# Patient Record
Sex: Male | Born: 1955 | Race: White | Hispanic: No | Marital: Married | Smoking: Former smoker
Health system: Southern US, Community
[De-identification: ages and names within clinical notes are randomized; demographics above are authoritative.]

## PROBLEM LIST (undated history)

## (undated) DIAGNOSIS — Z85528 Personal history of other malignant neoplasm of kidney: Secondary | ICD-10-CM

## (undated) DIAGNOSIS — G4730 Sleep apnea, unspecified: Secondary | ICD-10-CM

## (undated) DIAGNOSIS — M109 Gout, unspecified: Secondary | ICD-10-CM

## (undated) DIAGNOSIS — K5790 Diverticulosis of intestine, part unspecified, without perforation or abscess without bleeding: Secondary | ICD-10-CM

## (undated) DIAGNOSIS — K219 Gastro-esophageal reflux disease without esophagitis: Secondary | ICD-10-CM

## (undated) DIAGNOSIS — I10 Essential (primary) hypertension: Secondary | ICD-10-CM

## (undated) DIAGNOSIS — T7840XA Allergy, unspecified, initial encounter: Secondary | ICD-10-CM

## (undated) DIAGNOSIS — T884XXA Failed or difficult intubation, initial encounter: Secondary | ICD-10-CM

## (undated) DIAGNOSIS — C801 Malignant (primary) neoplasm, unspecified: Secondary | ICD-10-CM

## (undated) DIAGNOSIS — N200 Calculus of kidney: Secondary | ICD-10-CM

## (undated) DIAGNOSIS — R7303 Prediabetes: Secondary | ICD-10-CM

## (undated) HISTORY — PX: EXCISION OF ORAL TUMOR: SHX6269

## (undated) HISTORY — PX: COLONOSCOPY: SHX174

## (undated) HISTORY — DX: Allergy, unspecified, initial encounter: T78.40XA

## (undated) HISTORY — DX: Sleep apnea, unspecified: G47.30

## (undated) HISTORY — PX: POLYPECTOMY: SHX149

## (undated) HISTORY — DX: Prediabetes: R73.03

## (undated) HISTORY — PX: SURGERY SCROTAL / TESTICULAR: SUR1316

## (undated) HISTORY — DX: Personal history of other malignant neoplasm of kidney: Z85.528

---

## 2003-01-14 ENCOUNTER — Ambulatory Visit (HOSPITAL_COMMUNITY): Admission: RE | Admit: 2003-01-14 | Discharge: 2003-01-14 | Payer: Self-pay | Admitting: Urology

## 2003-03-29 ENCOUNTER — Emergency Department (HOSPITAL_COMMUNITY): Admission: EM | Admit: 2003-03-29 | Discharge: 2003-03-29 | Payer: Self-pay | Admitting: Emergency Medicine

## 2003-08-16 ENCOUNTER — Ambulatory Visit (HOSPITAL_COMMUNITY): Admission: RE | Admit: 2003-08-16 | Discharge: 2003-08-16 | Payer: Self-pay | Admitting: Urology

## 2004-03-04 ENCOUNTER — Ambulatory Visit (HOSPITAL_COMMUNITY): Admission: RE | Admit: 2004-03-04 | Discharge: 2004-03-04 | Payer: Self-pay | Admitting: Urology

## 2004-03-15 DIAGNOSIS — Z85528 Personal history of other malignant neoplasm of kidney: Secondary | ICD-10-CM

## 2004-03-15 HISTORY — PX: PARTIAL NEPHRECTOMY: SHX414

## 2004-03-15 HISTORY — DX: Personal history of other malignant neoplasm of kidney: Z85.528

## 2004-09-03 ENCOUNTER — Ambulatory Visit (HOSPITAL_COMMUNITY): Admission: RE | Admit: 2004-09-03 | Discharge: 2004-09-03 | Payer: Self-pay | Admitting: Urology

## 2005-08-05 ENCOUNTER — Ambulatory Visit (HOSPITAL_COMMUNITY): Admission: RE | Admit: 2005-08-05 | Discharge: 2005-08-05 | Payer: Self-pay | Admitting: Urology

## 2008-02-16 ENCOUNTER — Ambulatory Visit (HOSPITAL_COMMUNITY): Admission: RE | Admit: 2008-02-16 | Discharge: 2008-02-16 | Payer: Self-pay | Admitting: Urology

## 2011-08-03 ENCOUNTER — Ambulatory Visit (HOSPITAL_COMMUNITY)
Admission: RE | Admit: 2011-08-03 | Discharge: 2011-08-03 | Disposition: A | Discharge status: Home / Self Care | Payer: 59 | Source: Ambulatory Visit | Attending: Urology | Admitting: Urology

## 2011-08-03 ENCOUNTER — Other Ambulatory Visit: Payer: Self-pay | Admitting: Urology

## 2011-08-03 DIAGNOSIS — I10 Essential (primary) hypertension: Secondary | ICD-10-CM | POA: Insufficient documentation

## 2011-08-03 DIAGNOSIS — Z87891 Personal history of nicotine dependence: Secondary | ICD-10-CM | POA: Insufficient documentation

## 2011-08-03 DIAGNOSIS — C649 Malignant neoplasm of unspecified kidney, except renal pelvis: Secondary | ICD-10-CM | POA: Insufficient documentation

## 2013-05-08 ENCOUNTER — Encounter (HOSPITAL_COMMUNITY): Payer: Self-pay | Admitting: Oral and Maxillofacial Surgery

## 2013-05-08 DIAGNOSIS — K001 Supernumerary teeth: Secondary | ICD-10-CM

## 2013-05-08 DIAGNOSIS — M2740 Unspecified cyst of jaw: Secondary | ICD-10-CM | POA: Diagnosis present

## 2013-05-08 NOTE — Pre-Procedure Instructions (Signed)
Brian Sawyer  05/08/2013   Your procedure is scheduled on:  Monday May 14, 2013 at 7:15 AM.  Report to [facility] Stay Entrance "A"  Admitting at 5:30 AM.  Call this number if you have problems the morning of surgery: [phone]   Remember:   Do not eat food or drink liquids after midnight.   Take these medicines the morning of surgery with A SIP OF WATER: Zantac if needed   Do not wear jewelry.  Do not wear lotions, powders, or colognes. You may wear deodorant.  Men may shave face and neck.  Do not bring valuables to the hospital.  [facility] is not responsible for any belongings or valuables.               Contacts, dentures or bridgework may not be worn into surgery.  Leave suitcase in the car. After surgery it may be brought to your room.  For patients admitted to the hospital, discharge time is determined by your treatment team.               Patients discharged the day of surgery will not be allowed to drive home.  Name and phone number of your driver: Family/Friend  Special Instructions: Please shower using CHG soap the night before and the morning of your surgery   Please read over the following fact sheets that you were given: Pain Booklet, Coughing and Deep Breathing and Surgical Site Infection Prevention

## 2013-05-08 NOTE — H&P (Signed)
  Brian Sawyer is an 58 y.o. male.   Chief Complaint: "Swelling under my nose" HPI: Brian Sawyer is a 58 year old male that was referred to me by his Periodontist, Dr. Holley Bouche, for evaluation of swelling of the anterior maxilla.  A CBCT scan was taken and showed that the patient had a large cystic lesion of the pre-maxilla region associated with an impacted supernumerary #59.  I performed an incisional biopsy of the area in my office and sent the specimen to [facility]ology.  The pathology report concluded the lesion was a hemorrhagic dentigerious cyst.  Therefore, the patient will go to the OR for enucleation of the cyst and removal of the impacted #59.  The patient also reports a history of difficult intubation during his previous surgeries in the operating room.    PMHx:  Past Medical History  Diagnosis Date  . Difficult intubation   . Hypertension   . Gout   . Cancer 2006    Kidney Cancer  . Kidney stones     Kidney stones, right    PSx:  Past Surgical History  Procedure Laterality Date  . Partial nephrectomy Left 2006  . Surgery scrotal / testicular      Family Hx: History reviewed. No pertinent family history.  Social History:  reports that he quit smoking about 37 years ago. His smoking use included Cigarettes. He smoked 0.00 packs per day for 4 years. He does not have any smokeless tobacco history on file. He reports that he does not drink alcohol or use illicit drugs.  Allergies: No Known Allergies  Meds: Allopurinol 300 mg daily.  Labs: No results found for this or any previous visit (from the past 48 hour(s)).  Radiology: CBCT scan - cyst of the anterior maxilla associated with impacted tooth #59.  ROS: Pertinent items are noted in HPI.  Vitals: There were no vitals taken for this visit.  Physical Exam: General appearance: alert and cooperative Head: Normocephalic, without obvious abnormality, atraumatic Eyes: conjunctivae/corneas clear. PERRL, EOM's intact.  Fundi benign. Ears: normal TM's and external ear canals both ears Nose: mild swelling, Distortion of the alar base and swelling in the collumella / philtrum region Throat: abnormal findings: Gingival expansion in the anterior maxilla and lingualized #9. Neck: no adenopathy, no carotid bruit, no JVD, supple, symmetrical, trachea midline and thyroid not enlarged, symmetric, no tenderness/mass/nodules Resp: clear to auscultation bilaterally Cardio: regular rate and rhythm, S1, S2 normal, no murmur, click, rub or gallop GI: soft, non-tender; bowel sounds normal; no masses,  no organomegaly Extremities: extremities normal, atraumatic, no cyanosis or edema Pulses: 2+ and symmetric Skin: Skin color, texture, turgor normal. No rashes or lesions Lymph nodes: Cervical, supraclavicular, and axillary nodes normal. Neurologic: Alert and oriented X 3, normal strength and tone. Normal symmetric reflexes. Normal coordination and gait Occlusion is stable and repeatable, no odontogenic infection  noted. Large bilateral mandibular tori present.  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 1. I recommend that the patient have a fiber optic oral intubation using an Oral-rae tube. 2. In the OR I will Enucleate and Currettage his cyst of the anterior maxilla and remove the impacted #59.   3. Post operative instructions and prescriptions given today in my office today.  [place],Brenlee Koskela L  05/08/2013, 6:02 PM

## 2013-05-09 ENCOUNTER — Encounter (HOSPITAL_COMMUNITY)
Admission: RE | Admit: 2013-05-09 | Discharge: 2013-05-09 | Disposition: A | Discharge status: Home / Self Care | Payer: 59 | Source: Ambulatory Visit | Attending: Oral and Maxillofacial Surgery | Admitting: Oral and Maxillofacial Surgery

## 2013-05-09 ENCOUNTER — Encounter (HOSPITAL_COMMUNITY): Payer: Self-pay

## 2013-05-09 DIAGNOSIS — Z01812 Encounter for preprocedural laboratory examination: Secondary | ICD-10-CM | POA: Insufficient documentation

## 2013-05-09 DIAGNOSIS — Z01810 Encounter for preprocedural cardiovascular examination: Secondary | ICD-10-CM | POA: Insufficient documentation

## 2013-05-09 DIAGNOSIS — Z01818 Encounter for other preprocedural examination: Secondary | ICD-10-CM | POA: Insufficient documentation

## 2013-05-09 HISTORY — DX: Gastro-esophageal reflux disease without esophagitis: K21.9

## 2013-05-09 HISTORY — DX: Diverticulosis of intestine, part unspecified, without perforation or abscess without bleeding: K57.90

## 2013-05-09 LAB — BASIC METABOLIC PANEL
BUN: 13 mg/dL (ref 6–23)
CO2: 28 mEq/L (ref 19–32)
Calcium: 9.7 mg/dL (ref 8.4–10.5)
Chloride: 101 mEq/L (ref 96–112)
Creatinine, Ser: 0.9 mg/dL (ref 0.50–1.35)
GFR calc Af Amer: >90 mL/min (ref >90)
GFR calc non Af Amer: >90 mL/min (ref >90)
Glucose, Bld: 115 mg/dL — ABNORMAL HIGH (ref 70–99)
Potassium: 4.8 mEq/L (ref 3.7–5.3)
Sodium: 141 mEq/L (ref 137–147)

## 2013-05-09 LAB — CBC
HCT: 43.8 % (ref 39.0–52.0)
Hemoglobin: 15.1 g/dL (ref 13.0–17.0)
MCH: 30 pg (ref 26.0–34.0)
MCHC: 34.5 g/dL (ref 30.0–36.0)
MCV: 86.9 fL (ref 78.0–100.0)
Platelets: 191 10*3/uL (ref 150–400)
RBC: 5.04 MIL/uL (ref 4.22–5.81)
RDW: 14.2 % (ref 11.5–15.5)
WBC: 5.7 10*3/uL (ref 4.0–10.5)

## 2013-05-09 NOTE — Progress Notes (Signed)
05/09/13 0841  OBSTRUCTIVE SLEEP APNEA  BMI more than 35 kg/m2? 0  Obstructive Sleep Apnea Score 0  Score 4 or greater  Results sent to PCP   This patient is at risk for sleep apnea using the STOP Bang tool used during a pre-surgical visit. A score of 4 or greater is at risk for sleep apnea.

## 2013-05-09 NOTE — Progress Notes (Addendum)
Anesthesia Note:  Patient is a 58 year old male with a hemorrhagic inflamed dentigrous cyst who is scheduled for cyst removal and removal of tooth # 59 and placement of bone graft in the interior maxilla on 05/14/13 by Dr. Buelah Manis.  He is scheduled to be a first case.    Anesthesia history includes DIFFICULT INTUBATION in 2005/2006 at [facility] when he underwent left partial nephrectomy for renal cancer.  Patient was seen by Lelon Perla, CRNA regarding difficult intubation history.  By her account, he was noted to have a somewhat small mouth opening (3 finger breadths) and limited neck mobility. There was also some shifting of his left teeth secondary to his cyst.  Mallampati 3. Jeani Hawking discussed teeth advisory and possible options for intubation including glidescope, awake look, fiberoptic intubation.    History includes HTN (not requiring medication for several years), right nephrolithiasis, left renal cancer s/p partial nephrectomy, GERD, diverticulosis, former smoker. He is not currently seeing a PCP.    EKG on 05/09/13 showed NSR, LAD.  CXR on 05/09/13 showed no active cardiopulmonary disease.  Preoperative labs noted.    Anesthesia records from [facility] are still pending.  I'll review once available.  Darion Hugh [facility] Short Stay Center/Anesthesiology Phone [phone] 05/09/2013 5:23 PM  Addendum: 05/10/2013 6:00 PM Anesthesia records from [facility] from 04/17/03 reviewed.  According to notes, he was an unanticipated difficult intubation requiring multiple attempts at airway securing.  DL X 4 and Fast Track LMA were unsuccessful.  Ultimately successful with an asleep fiberoptic intubation. Awake or asleep FOI for future GETA recommended.

## 2013-05-09 NOTE — Progress Notes (Signed)
Patient informed Nurse that he was a difficult intubation and it was discovered when having a partial nephrectomy in 2005 at [facility]. Will request records. Nurse called Ebony Hail, [place] however she was not available at this time. Nurse also called Dr. Conrad [place] (Anesthesia) however he was noted to be at Day Surgery. Lelon Perla called for assistance and she stated she would come to talk to patient. Patient denied having a PCP and patient sees Dr. Franchot Gallo at [facility] whom he has not seen in years as he has not had any issues. Patient denied having any kidney, cardiac, or pulmonary issues.

## 2013-05-10 ENCOUNTER — Encounter (HOSPITAL_COMMUNITY): Payer: Self-pay

## 2013-05-13 MED ORDER — CEFAZOLIN SODIUM-DEXTROSE 2-3 GM-% IV SOLR
2.0000 g | INTRAVENOUS | Status: AC
  Administered 2013-05-14: 2 g via INTRAVENOUS
  Filled 2013-05-13: qty 50

## 2013-05-14 ENCOUNTER — Encounter (HOSPITAL_COMMUNITY): Payer: 59 | Admitting: Vascular Surgery

## 2013-05-14 ENCOUNTER — Encounter (HOSPITAL_COMMUNITY): Payer: Self-pay | Admitting: *Deleted

## 2013-05-14 ENCOUNTER — Ambulatory Visit (HOSPITAL_COMMUNITY): Payer: 59 | Admitting: Anesthesiology

## 2013-05-14 ENCOUNTER — Encounter (HOSPITAL_COMMUNITY)
Admission: RE | Disposition: A | Discharge status: Home / Self Care | Payer: Self-pay | Source: Ambulatory Visit | Attending: Oral and Maxillofacial Surgery

## 2013-05-14 ENCOUNTER — Ambulatory Visit (HOSPITAL_COMMUNITY)
Admission: RE | Admit: 2013-05-14 | Discharge: 2013-05-14 | Disposition: A | Discharge status: Home / Self Care | Payer: 59 | Source: Ambulatory Visit | Attending: Oral and Maxillofacial Surgery | Admitting: Oral and Maxillofacial Surgery

## 2013-05-14 DIAGNOSIS — J338 Other polyp of sinus: Secondary | ICD-10-CM | POA: Insufficient documentation

## 2013-05-14 DIAGNOSIS — Z85528 Personal history of other malignant neoplasm of kidney: Secondary | ICD-10-CM | POA: Insufficient documentation

## 2013-05-14 DIAGNOSIS — K090 Developmental odontogenic cysts: Secondary | ICD-10-CM | POA: Insufficient documentation

## 2013-05-14 DIAGNOSIS — Z87442 Personal history of urinary calculi: Secondary | ICD-10-CM | POA: Insufficient documentation

## 2013-05-14 DIAGNOSIS — Z87891 Personal history of nicotine dependence: Secondary | ICD-10-CM | POA: Insufficient documentation

## 2013-05-14 DIAGNOSIS — M109 Gout, unspecified: Secondary | ICD-10-CM | POA: Insufficient documentation

## 2013-05-14 DIAGNOSIS — M2740 Unspecified cyst of jaw: Secondary | ICD-10-CM | POA: Diagnosis present

## 2013-05-14 DIAGNOSIS — K001 Supernumerary teeth: Secondary | ICD-10-CM

## 2013-05-14 DIAGNOSIS — I10 Essential (primary) hypertension: Secondary | ICD-10-CM | POA: Insufficient documentation

## 2013-05-14 HISTORY — DX: Failed or difficult intubation, initial encounter: T88.4XXA

## 2013-05-14 HISTORY — PX: EAR CYST EXCISION: SHX22

## 2013-05-14 HISTORY — DX: Calculus of kidney: N20.0

## 2013-05-14 HISTORY — PX: TOOTH EXTRACTION: SHX859

## 2013-05-14 HISTORY — DX: Essential (primary) hypertension: I10

## 2013-05-14 HISTORY — DX: Gout, unspecified: M10.9

## 2013-05-14 HISTORY — DX: Malignant (primary) neoplasm, unspecified: C80.1

## 2013-05-14 SURGERY — CYST REMOVAL
Anesthesia: General | Site: Mouth

## 2013-05-14 MED ORDER — ROCURONIUM BROMIDE 50 MG/5ML IV SOLN
INTRAVENOUS | Status: AC
  Filled 2013-05-14: qty 1

## 2013-05-14 MED ORDER — PHENYLEPHRINE HCL 10 MG/ML IJ SOLN
INTRAMUSCULAR | Status: DC | PRN
  Administered 2013-05-14: 80 ug via INTRAVENOUS
  Administered 2013-05-14: 40 ug via INTRAVENOUS
  Administered 2013-05-14 (×3): 80 ug via INTRAVENOUS

## 2013-05-14 MED ORDER — ACETAMINOPHEN 325 MG PO TABS: 325.0000 mg | ORAL_TABLET | ORAL | Status: DC | PRN

## 2013-05-14 MED ORDER — LACTATED RINGERS IV SOLN
INTRAVENOUS | Status: DC | PRN
  Administered 2013-05-14 (×2): via INTRAVENOUS

## 2013-05-14 MED ORDER — OXYMETAZOLINE HCL 0.05 % NA SOLN
NASAL | Status: AC
  Filled 2013-05-14: qty 15

## 2013-05-14 MED ORDER — FENTANYL CITRATE 0.05 MG/ML IJ SOLN
INTRAMUSCULAR | Status: DC | PRN
  Administered 2013-05-14 (×2): 50 ug via INTRAVENOUS

## 2013-05-14 MED ORDER — FENTANYL CITRATE 0.05 MG/ML IJ SOLN: 25.0000 ug | INTRAMUSCULAR | Status: DC | PRN

## 2013-05-14 MED ORDER — DEXAMETHASONE SODIUM PHOSPHATE 4 MG/ML IJ SOLN
INTRAMUSCULAR | Status: AC
  Filled 2013-05-14: qty 1

## 2013-05-14 MED ORDER — ONDANSETRON HCL 4 MG/2ML IJ SOLN
INTRAMUSCULAR | Status: DC | PRN
  Administered 2013-05-14: 4 mg via INTRAVENOUS

## 2013-05-14 MED ORDER — LIDOCAINE HCL (CARDIAC) 20 MG/ML IV SOLN
INTRAVENOUS | Status: DC | PRN
  Administered 2013-05-14: 80 mg via INTRAVENOUS

## 2013-05-14 MED ORDER — EPHEDRINE SULFATE 50 MG/ML IJ SOLN
INTRAMUSCULAR | Status: DC | PRN
Start: 2013-05-14 — End: 2013-05-14
  Administered 2013-05-14: 5 mg via INTRAVENOUS
  Administered 2013-05-14 (×2): 10 mg via INTRAVENOUS

## 2013-05-14 MED ORDER — ONDANSETRON HCL 4 MG/2ML IJ SOLN: 4.0000 mg | Freq: Once | INTRAMUSCULAR | Status: DC | PRN

## 2013-05-14 MED ORDER — LIDOCAINE-EPINEPHRINE 2 %-1:100000 IJ SOLN
INTRAMUSCULAR | Status: AC
  Filled 2013-05-14: qty 17

## 2013-05-14 MED ORDER — SUCCINYLCHOLINE CHLORIDE 20 MG/ML IJ SOLN
INTRAMUSCULAR | Status: DC | PRN
  Administered 2013-05-14: 100 mg via INTRAVENOUS

## 2013-05-14 MED ORDER — ACETAMINOPHEN 160 MG/5ML PO SOLN
325.0000 mg | ORAL | Status: DC | PRN
  Filled 2013-05-14: qty 20.3

## 2013-05-14 MED ORDER — PROPOFOL 10 MG/ML IV BOLUS
INTRAVENOUS | Status: AC
  Filled 2013-05-14: qty 20

## 2013-05-14 MED ORDER — SUCCINYLCHOLINE CHLORIDE 20 MG/ML IJ SOLN
INTRAMUSCULAR | Status: AC
  Filled 2013-05-14: qty 1

## 2013-05-14 MED ORDER — HEMOSTATIC AGENTS (NO CHARGE) OPTIME
TOPICAL | Status: DC | PRN
  Administered 2013-05-14: 1 via TOPICAL

## 2013-05-14 MED ORDER — 0.9 % SODIUM CHLORIDE (POUR BTL) OPTIME
TOPICAL | Status: DC | PRN
  Administered 2013-05-14: 1000 mL

## 2013-05-14 MED ORDER — METHYLENE BLUE 1 % INJ SOLN
INTRAMUSCULAR | Status: AC
  Filled 2013-05-14: qty 10

## 2013-05-14 MED ORDER — LIDOCAINE HCL (CARDIAC) 20 MG/ML IV SOLN
INTRAVENOUS | Status: AC
  Filled 2013-05-14: qty 5

## 2013-05-14 MED ORDER — BUPIVACAINE-EPINEPHRINE (PF) 0.5% -1:200000 IJ SOLN
INTRAMUSCULAR | Status: AC
  Filled 2013-05-14: qty 10.8

## 2013-05-14 MED ORDER — DEXAMETHASONE SODIUM PHOSPHATE 4 MG/ML IJ SOLN
INTRAMUSCULAR | Status: DC | PRN
  Administered 2013-05-14 (×2): 4 mg via INTRAVENOUS

## 2013-05-14 MED ORDER — PROPOFOL 10 MG/ML IV BOLUS
INTRAVENOUS | Status: DC | PRN
  Administered 2013-05-14: 150 mg via INTRAVENOUS
  Administered 2013-05-14: 50 mg via INTRAVENOUS

## 2013-05-14 MED ORDER — PHENYLEPHRINE HCL 10 MG/ML IJ SOLN
10.0000 mg | INTRAVENOUS | Status: DC | PRN
  Administered 2013-05-14: 30 ug/min via INTRAVENOUS
  Administered 2013-05-14: 15 ug/min via INTRAVENOUS

## 2013-05-14 MED ORDER — ONDANSETRON HCL 4 MG/2ML IJ SOLN
INTRAMUSCULAR | Status: AC
  Filled 2013-05-14: qty 2

## 2013-05-14 MED ORDER — EPHEDRINE SULFATE 50 MG/ML IJ SOLN
INTRAMUSCULAR | Status: AC
  Filled 2013-05-14: qty 1

## 2013-05-14 MED ORDER — KETOROLAC TROMETHAMINE 30 MG/ML IJ SOLN: 15.0000 mg | Freq: Once | INTRAMUSCULAR | Status: DC | PRN

## 2013-05-14 MED ORDER — ARTIFICIAL TEARS OP OINT
TOPICAL_OINTMENT | OPHTHALMIC | Status: AC
  Filled 2013-05-14: qty 3.5

## 2013-05-14 MED ORDER — STERILE WATER FOR INJECTION IJ SOLN
INTRAMUSCULAR | Status: AC
  Filled 2013-05-14: qty 10

## 2013-05-14 MED ORDER — LIDOCAINE-EPINEPHRINE 2 %-1:100000 IJ SOLN
INTRAMUSCULAR | Status: DC | PRN
  Administered 2013-05-14: 6.8 mL

## 2013-05-14 MED ORDER — MIDAZOLAM HCL 2 MG/2ML IJ SOLN
INTRAMUSCULAR | Status: AC
  Filled 2013-05-14: qty 2

## 2013-05-14 MED ORDER — MIDAZOLAM HCL 5 MG/5ML IJ SOLN
INTRAMUSCULAR | Status: DC | PRN
  Administered 2013-05-14 (×2): 1 mg via INTRAVENOUS

## 2013-05-14 MED ORDER — BUPIVACAINE-EPINEPHRINE 0.5% -1:200000 IJ SOLN
INTRAMUSCULAR | Status: DC | PRN
  Administered 2013-05-14: 3.6 mL

## 2013-05-14 MED ORDER — FENTANYL CITRATE 0.05 MG/ML IJ SOLN
INTRAMUSCULAR | Status: AC
  Filled 2013-05-14: qty 5

## 2013-05-14 SURGICAL SUPPLY — 56 items
ALCOHOL 70% 16 OZ (MISCELLANEOUS) ×2 IMPLANT
ATTRACTOMAT 16X20 MAGNETIC DRP (DRAPES) ×3 IMPLANT
BLADE SURG 15 STRL LF DISP TIS (BLADE) ×2 IMPLANT
BLADE SURG 15 STRL SS (BLADE) ×6
BONE CANC CHIPS 20CC PCAN1/4 (Bone Implant) ×3 IMPLANT
BONE CHIP PRESERV 5CC PCAN5 (Bone Implant) ×3 IMPLANT
BUR CROSS CUT FISSURE 1.6 (BURR) ×1 IMPLANT
BUR CROSS CUT FISSURE 1.6MM (BURR) ×1
BUR EGG ELITE 4.0 (BURR) ×1 IMPLANT
BUR EGG ELITE 4.0MM (BURR) ×1
BUR RND FLUTED 2.5 (BURR) ×2 IMPLANT
CANISTER SUCTION 2500CC (MISCELLANEOUS) ×3 IMPLANT
CHIPS CANC BONE 20CC PCAN1/4 (Bone Implant) ×1 IMPLANT
CLEANER TIP ELECTROSURG 2X2 (MISCELLANEOUS) ×2 IMPLANT
CONT SPEC 4OZ CLIKSEAL STRL BL (MISCELLANEOUS) ×6 IMPLANT
COVER SURGICAL LIGHT HANDLE (MISCELLANEOUS) ×3 IMPLANT
ELECT COATED BLADE 2.86 ST (ELECTRODE) ×2 IMPLANT
ELECT NDL BLADE 2-5/6 (NEEDLE) IMPLANT
ELECT NEEDLE BLADE 2-5/6 (NEEDLE) ×3 IMPLANT
ELECT REM PT RETURN 9FT ADLT (ELECTROSURGICAL) ×3
ELECTRODE REM PT RTRN 9FT ADLT (ELECTROSURGICAL) IMPLANT
GAUZE PACKING FOLDED 2  STR (GAUZE/BANDAGES/DRESSINGS) ×2
GAUZE PACKING FOLDED 2 STR (GAUZE/BANDAGES/DRESSINGS) ×1 IMPLANT
GAUZE SPONGE 4X4 16PLY XRAY LF (GAUZE/BANDAGES/DRESSINGS) ×3 IMPLANT
GLOVE BIO SURGEON STRL SZ7 (GLOVE) ×5 IMPLANT
GLOVE BIOGEL PI IND STRL 6.5 (GLOVE) IMPLANT
GLOVE BIOGEL PI INDICATOR 6.5 (GLOVE) ×2
GLOVE ORTHO TXT STRL SZ7.5 (GLOVE) ×3 IMPLANT
GOWN STRL REUS W/ TWL LRG LVL3 (GOWN DISPOSABLE) IMPLANT
GOWN STRL REUS W/TWL LRG LVL3 (GOWN DISPOSABLE) ×6
GRAFT BNE CANC CHIPS 1-8 20CC (Bone Implant) IMPLANT
GRAFT BNE CANC CHIPS 1-8 5CC (Bone Implant) IMPLANT
KIT BASIN OR (CUSTOM PROCEDURE TRAY) ×3 IMPLANT
KIT ROOM TURNOVER OR (KITS) ×3 IMPLANT
NDL BLUNT 16X1.5 OR ONLY (NEEDLE) ×2 IMPLANT
NDL DENTAL 27 LONG (NEEDLE) ×1 IMPLANT
NEEDLE BLUNT 16X1.5 OR ONLY (NEEDLE) ×6 IMPLANT
NEEDLE DENTAL 27 LONG (NEEDLE) ×6 IMPLANT
NS IRRIG 1000ML POUR BTL (IV SOLUTION) ×3 IMPLANT
PACK EENT II TURBAN DRAPE (CUSTOM PROCEDURE TRAY) ×3 IMPLANT
PAD ARMBOARD 7.5X6 YLW CONV (MISCELLANEOUS) ×6 IMPLANT
PENCIL BUTTON HOLSTER BLD 10FT (ELECTRODE) ×2 IMPLANT
SOLUTION BETADINE 4OZ (MISCELLANEOUS) ×2 IMPLANT
SPONGE GAUZE 4X4 12PLY (GAUZE/BANDAGES/DRESSINGS) IMPLANT
SPONGE SURGIFOAM ABS GEL 12-7 (HEMOSTASIS) ×2 IMPLANT
SUT CHROMIC 3 0 PS 2 (SUTURE) ×3 IMPLANT
SUT VIC AB 4-0 RB1 27 (SUTURE) ×3
SUT VIC AB 4-0 RB1 27X BRD (SUTURE) IMPLANT
SYR 50ML SLIP (SYRINGE) ×6 IMPLANT
SYR BULB IRRIGATION 50ML (SYRINGE) IMPLANT
TOOTHBRUSH ADULT (PERSONAL CARE ITEMS) ×2 IMPLANT
TOWEL OR 17X24 6PK STRL BLUE (TOWEL DISPOSABLE) ×3 IMPLANT
TOWEL OR 17X26 10 PK STRL BLUE (TOWEL DISPOSABLE) ×3 IMPLANT
TUBE CONNECTING 12'X1/4 (SUCTIONS) ×1
TUBE CONNECTING 12X1/4 (SUCTIONS) ×2 IMPLANT
WATER STERILE IRR 1000ML POUR (IV SOLUTION) ×1 IMPLANT

## 2013-05-14 NOTE — Anesthesia Procedure Notes (Addendum)
Performed by: Berle Mull   Procedure Name: Intubation Date/Time: 05/14/2013 7:45 AM Performed by: Berle Mull Pre-anesthesia Checklist: Patient identified, Timeout performed, Emergency Drugs available, Suction available and Patient being monitored Patient Re-evaluated:Patient Re-evaluated prior to inductionOxygen Delivery Method: Circle system utilized Preoxygenation: Pre-oxygenation with 100% oxygen Intubation Type: IV induction and Inhalational induction Ventilation: Two handed mask ventilation required and Oral airway inserted - appropriate to patient size Grade View: Grade I Tube type: Oral Rae Tube size: 7.5 mm Number of attempts: 1 Airway Equipment and Method: Video-laryngoscopy and Stylet Placement Confirmation: ETT inserted through vocal cords under direct vision,  positive ETCO2 and breath sounds checked- equal and bilateral Secured at: 21 cm Tube secured with: Tape Dental Injury: Teeth and Oropharynx as per pre-operative assessment  Future Recommendations: Recommend- induction with short-acting agent, and alternative techniques readily available Comments: Pt. Required two-handed mask with oral airway.  Despite history of difficult intubation at outside hospital, Grade I view with Glidescope.

## 2013-05-14 NOTE — OR Nursing (Signed)
At [phone], reassessed patient's pain related fall; patient denied pain at this time.  Patient rolled to left to assess skin integrity; posterior lower spine, bilateral buttocks and bilateral hip skin integrity intact.  No bruising noted at this time.  Patient informed to notify Phippip,PACU RN if pain status changes.

## 2013-05-14 NOTE — Anesthesia Postprocedure Evaluation (Signed)
  Anesthesia Post-op Note  Patient: Brian Sawyer  Procedure(s) Performed: Procedure(s): 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 (N/A) REMOVAL OF SUPERNUMERARY TOOTH NUMBER FIFTY-NINE WITH BONE GRAFTING (N/A)  Patient Location: PACU  Anesthesia Type:General  Level of Consciousness: awake, alert  and oriented  Airway and Oxygen Therapy: Patient Spontanous Breathing  Post-op Pain: mild  Post-op Assessment: Post-op Vital signs reviewed  Post-op Vital Signs: Reviewed and stable  Complications: No apparent anesthesia complications

## 2013-05-14 NOTE — Interval H&P Note (Signed)
History and Physical Interval Note:  05/14/2013 7:15 AM  Brian Sawyer  has presented today for surgery, with the diagnosis of [place]  The various methods of treatment have been discussed with the patient and family. After consideration of risks, benefits and other options for treatment, the patient has consented to  Procedure(s): CYST REMOVAL (N/A) REMOVAL OF TOOTH #59 PLACE BONE GRAFT IN INTERIOR MAXILLA (N/A) and removal of cyst from the anterior maxilla as a surgical intervention .  The patient's history has been reviewed, patient examined, no change in status, stable for surgery.  I have reviewed the patient's chart and labs.  Questions were answered to the patient's satisfaction.     [place],Garner Dullea L

## 2013-05-14 NOTE — Op Note (Signed)
05/14/2013  9:08 AM  PATIENT:  Brian Sawyer  58 y.o. male  PRE-OPERATIVE DIAGNOSIS:  HEMORRHAGIC INFLAMED DENTIGEROUS CYST OF LEFT MAXILLA, SUPERNUMERARY TOOTH #59  POST-OPERATIVE DIAGNOSIS:  HEMORRHAGIC INFLAMED DENTIGEROUS CYST, SUPERNUMERARY TOOTH #59, LEFT MAXILLARY SINUS PSUEDOCYST  PROCEDURE:  Procedure(s): 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 (N/A) REMOVAL OF SUPERNUMERARY TOOTH #59  WITH BONE GRAFTING (N/A) BIOPSY OF LEFT MAXILLARY SINUS PSUEDOCYST  INDICATIONS FOR PROCEDURE: Brian Sawyer is a 58 year old male referred for a biopsy of a lesion in the anterior maxilla.  A cone beam CT scan was taken which shows a large radiolucency of the anterior maxilla associated with an impacted tooth #59.  I performed an incisional biopsy of this lesion and sent the specimen to the [place] at [facility] Pathology Department.  The results were a Hemorraghic Dentigerous Cyst.  The patient was then taken to the operating room for removal of the the remaining cyst, removal of #59, and bone grafting the defect.     SURGEON:  Surgeon(s) and Role:    * Isac Caddy, DDS - Primary  PHYSICIAN ASSISTANT: NONE  ASSISTANTS: Ottie Glazier, Surgical Assistant   PROCEDURE IN DETAIL: The patient was seen in the preoperative area. All questions were answered the history and physical was updated and verified.  The consent was reviewed and signed .  The patient was taken to the operating room by the anesthesia service.  The patient was taken to the operating room.  Patient was placed on the table in the supine position and orally intubated with a GlideScope. The patient was prepped and draped in the usual sterile fashion for all maxillofacial surgery procedures. Local anesthesia was placed into the anterior maxilla.  A bovie cautery set at 30/30 was used to make a LeFort style incision in the anterior maxillary vestibule. A periosteal  elevator was used to dissect down to bone. The cyst, which was greater than 1.25 cm, was identified and a currette was used to enucleate the cyst.  It was noted that the cyst had destroyed the bone on the anterior maxillary sinus wall and there was a direct communication from the cyst to the left maxillary sinus.  A ronguer was used to remove the impacted tooth #59.  Copious irrigation was used.  The sinus was explored to rule out the presence of cyst.  No dental cyst was found in the left maxillary sinus; however, there was a polyp which appeared to be a psuedocyst.  This lesion was also biopsied and sent to pathology along with the lesion in the anterior maxilla. As stated earlier, there was a communication from the cystic cavity into the left maxillary sinus. Gel Foam was then placed as barrier from the cyst to the sinus.  Next, a cancellous allograft was placed into the bony defect.  The area was closed with 4-0 vicryl suture.  All counts were correct and the patient was extubated in a stable fashion.  As the patient was being transferred to the stretcher from the operating room table.  The patient had an assisted slide to the floor into a standing position.  I did not witness this event; however, as the operating room nursing staff explains, the patient essentially rolled off of the bed away from the awaiting stretcher onto the floor in a standing position.  He did not hit his head or traumatize any extremities.  He complained of no pain or injury upon my  return to the room after I was notified.   ANESTHESIA:   general  EBL:  Total I/O In: 1000 [I.V.:1000] Out: Minimal  BLOOD ADMINISTERED:none  DRAINS: none   LOCAL MEDICATIONS USED:  0.5% MARCAINE with 1:200,000 epinephrine,  2 carpules   and 2% LIDOCAINE with 1:100,000 epinephrine 4 carpules  SPECIMEN:  Source of Specimen:  Left Maxilla and Left Maxillary Sinus  DISPOSITION OF SPECIMEN:  PATHOLOGY  COUNTS:  YES  TOURNIQUET:  * No  tourniquets in log *  DICTATION: .Note written in EPIC  PLAN OF CARE: Discharge to home after PACU  PATIENT DISPOSITION:  PACU - hemodynamically stable.   Delay start of Pharmacological VTE agent (>24hrs) due to surgical blood loss or risk of bleeding: not applicable

## 2013-05-14 NOTE — OR Nursing (Signed)
Patient fell off the operating table with transfer post-operatively. Three providers were at the patient's side - one at the head and two at the sides. He was rolled onto his right side by a provider, continued to roll on top of that provider, and landed seated on the floor. He was re-attached to cardiac and oxygen saturation monitors. He denied pain. His skin appeared within defined limits. Tamela Oddi, DDS and Ermalene Postin, MD were contacted and they both assessed the patient while in the operating room. He proceeded to the post anesthesia care unit.  Narda Rutherford, RN

## 2013-05-14 NOTE — Discharge Instructions (Signed)
HOME CARE INSTRUCTIONS ORAL SURGERY PROCEDURES  MEDICATION: Some soreness and discomfort is normal following a dental procedure.  Use of a non-aspirin pain product, like acetaminophen, is recommended.  If pain is not relieved, please call the surgeon who performed the procedure.  ORAL HYGIENE: Brushing of the teeth should be resumed the day after surgery.  Begin slowly and softly.   DIET: A balanced diet is very important during the healing process.   Liquids and soft foods are advisable.  Drink clear liquids at first, then progress to other liquids as tolerated.  If teeth were removed, do not use a straw for at least 2 days.  Try to limit between-meal snacks which are high in sugar.  ACTIVITY: Limit to quiet indoor activities for 24 hours following surgery.  RETURN TO SCHOOL OR WORK: You may return to school or work in a day or two, or as indicated by your dentist.  GENERAL EXPECTATIONS:  -Bleeding is to be expected after teeth are removed.  The bleeding should slow down after several hours.  -Stitches may be in place, which will fall out by themselves.  If the child pulls them out, do not be concerned.  CALL YOUR DOCTOR IS THESE OCCUR:  -Temperature is 101 degrees or more.  -Persistent bright red bleeding.  -Severe pain.  Return to the doctor's office two weeks. Call to make an appointment.  Patient Signature:  ________________________________________________________  Nurse's Signature:  ________________________________________________________

## 2013-05-14 NOTE — Preoperative (Signed)
Beta Blockers   Reason not to administer Beta Blockers:Not Applicable 

## 2013-05-14 NOTE — Brief Op Note (Addendum)
05/14/2013  9:04 AM  PATIENT:  Brian Sawyer  58 y.o. male  PRE-OPERATIVE DIAGNOSIS:  HEMORRHAGIC INFLAMED DENTIGEROUS CYST OF LEFT MAXILLA, SUPERNUMERARY TOOTH #59  POST-OPERATIVE DIAGNOSIS:  HEMORRHAGIC INFLAMED DENTIGEROUS CYST, SUPERNUMERARY TOOTH #59, LEFT MAXILLARY SINUS PSUEDOCYST  PROCEDURE:  Procedure(s): 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 (N/A) REMOVAL OF SUPERNUMERARY TOOTH NUMBER FIFTY-NINE WITH BONE GRAFTING (N/A) BIOPSY OF LEFT MAXILLARY SINUS PSUEDOCYST  SURGEON:  Surgeon(s) and Role:    * Isac Caddy, DDS - Primary  PHYSICIAN ASSISTANT: NONE  ASSISTANTS: Ottie Glazier, Surgical Assistant   ANESTHESIA:   general  EBL:  Total I/O In: 1000 [I.V.:1000] Out: -   BLOOD ADMINISTERED:none  DRAINS: none   LOCAL MEDICATIONS USED:  MARCAINE 2 carpules   and LIDOCAINE 4 carpules  SPECIMEN:  Source of Specimen:  Left Maxilla and Left Maxillary Sinus  DISPOSITION OF SPECIMEN:  PATHOLOGY  COUNTS:  YES  TOURNIQUET:  * No tourniquets in log *  DICTATION: .Note written in EPIC  PLAN OF CARE: Discharge to home after PACU  PATIENT DISPOSITION:  PACU - hemodynamically stable.   Delay start of Pharmacological VTE agent (>24hrs) due to surgical blood loss or risk of bleeding: not applicable

## 2013-05-14 NOTE — Anesthesia Preprocedure Evaluation (Addendum)
Anesthesia Evaluation    Airway Mallampati: IV TM Distance: >3 FB Neck ROM: Full    Dental  (+) Teeth Intact, Caps,    Pulmonary former smoker,          Cardiovascular hypertension, Rhythm:Regular Rate:Normal     Neuro/Psych    GI/Hepatic   Endo/Other    Renal/GU      Musculoskeletal   Abdominal   Peds  Hematology   Anesthesia Other Findings Teeth have shifted d/t cyst; Pt. Reports toro on the bottom of the mouth- A. Keimani Laufer, CRNA  Reproductive/Obstetrics                          Anesthesia Physical Anesthesia Plan  ASA: II  Anesthesia Plan: General   Post-op Pain Management:    Induction: Intravenous  Airway Management Planned: Oral ETT and Video Laryngoscope Planned  Additional Equipment:   Intra-op Plan:   Post-operative Plan: Extubation in OR  Informed Consent: I have reviewed the patients History and Physical, chart, labs and discussed the procedure including the risks, benefits and alternatives for the proposed anesthesia with the patient or authorized representative who has indicated his/her understanding and acceptance.   Dental advisory given  Plan Discussed with: CRNA and Anesthesiologist  Anesthesia Plan Comments:         Anesthesia Quick Evaluation

## 2013-05-14 NOTE — Transfer of Care (Signed)
Immediate Anesthesia Transfer of Care Note  Patient: Brian Sawyer  Procedure(s) Performed: Procedure(s): 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 (N/A) REMOVAL OF SUPERNUMERARY TOOTH NUMBER FIFTY-NINE WITH BONE GRAFTING (N/A)  Patient Location: PACU  Anesthesia Type:General  Level of Consciousness: awake, alert  and oriented  Airway & Oxygen Therapy: Patient Spontanous Breathing and Patient connected to nasal cannula oxygen  Post-op Assessment: Report given to PACU RN and Post -op Vital signs reviewed and stable  Post vital signs: Reviewed and stable  Complications: No apparent anesthesia complications

## 2013-05-16 ENCOUNTER — Encounter (HOSPITAL_COMMUNITY): Payer: Self-pay | Admitting: Oral and Maxillofacial Surgery

## 2014-03-04 ENCOUNTER — Encounter: Payer: Self-pay | Admitting: Medical

## 2014-03-04 ENCOUNTER — Ambulatory Visit (INDEPENDENT_AMBULATORY_CARE_PROVIDER_SITE_OTHER): Payer: 59 | Admitting: Medical

## 2014-03-04 VITALS — BP 151/88 | HR 89 | Temp 98.4°F | Ht 74.0 in | Wt 237.0 lb

## 2014-03-04 DIAGNOSIS — M1000 Idiopathic gout, unspecified site: Secondary | ICD-10-CM

## 2014-03-04 DIAGNOSIS — IMO0001 Reserved for inherently not codable concepts without codable children: Secondary | ICD-10-CM | POA: Insufficient documentation

## 2014-03-04 DIAGNOSIS — M109 Gout, unspecified: Secondary | ICD-10-CM | POA: Insufficient documentation

## 2014-03-04 DIAGNOSIS — K219 Gastro-esophageal reflux disease without esophagitis: Secondary | ICD-10-CM

## 2014-03-04 DIAGNOSIS — Z85528 Personal history of other malignant neoplasm of kidney: Secondary | ICD-10-CM

## 2014-03-04 DIAGNOSIS — R030 Elevated blood-pressure reading, without diagnosis of hypertension: Secondary | ICD-10-CM

## 2014-03-04 NOTE — Assessment & Plan Note (Signed)
Arthritis(Gout)- controlled by diet and gout medicine allopurinol.

## 2014-03-04 NOTE — Assessment & Plan Note (Signed)
Cancer- kidney cancer hx- Partial lt  nephrectomy in 2005. Found by his urologist. Followed by specialist. Sees urologist every year. He continue to see urology for GU issues.

## 2014-03-04 NOTE — Assessment & Plan Note (Signed)
Gerd- controlled by rantidine.

## 2014-03-04 NOTE — Progress Notes (Signed)
Subjective:    Patient ID: Brian Sawyer, male    DOB: 1956/02/23, 58 y.o.   MRN: [id number]  HPI   I have reviewed pt PMH, PSH, FH, Social History and Surgical History.  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 Nonsmoker, Very rare alcohol use.(2 beers a month), walks 2-3 times a week 2 miles, married 3 children.   Arthritis(Gout)- controlled by diet and gout medicine allopurinol.  Cancer- kidney cancer hx- Partial lt  nephrectomy in 2005. Found by his urologist. Followed by specialist. Sees urologist every year. He continue to see urology for GU issues.  Gerd- controlled by rantidine.  Hypertension- Pt bp is not controlled and it usually high. He historically does not want to use any meds for his blood pressure. Pt does not check his bp. He does not have a machine.  Kidney stones- History of in the past.    Past Medical History  Diagnosis Date  . Hypertension   . Gout   . Cancer 2006    Kidney Cancer  . Kidney stones     Kidney stones, right  . GERD (gastroesophageal reflux disease)   . Diverticulosis   . Dry skin   . Difficult intubation     on 04/17/03 [facility] MDA rec: awake or asleep fiberoptic intubation  . Arthritis     History   Social History  . Marital Status: Married    Spouse Name: N/A    Number of Children: N/A  . Years of Education: N/A   Occupational History  . Not on file.   Social History Main Topics  . Smoking status: Former Smoker -- 4 years    Types: Cigarettes    Quit date: 04/07/1976  . Smokeless tobacco: Not on file  . Alcohol Use: Yes     Comment: rare  . Drug Use: No  . Sexual Activity: Not on file   Other Topics Concern  . Not on file   Social History Narrative    Past Surgical History  Procedure Laterality Date  . Partial nephrectomy Left 2006  . Surgery scrotal / testicular    . Ear cyst excision N/A 05/14/2013    Procedure: 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;  Surgeon: Isac Caddy, DDS;  Location: [place];  Service: Oral Surgery;  Laterality: N/A;  . Tooth extraction N/A 05/14/2013    Procedure: REMOVAL OF SUPERNUMERARY TOOTH NUMBER FIFTY-NINE WITH BONE GRAFTING;  Surgeon: Isac Caddy, DDS;  Location: [place];  Service: Oral Surgery;  Laterality: N/A;  . Nephreectom    . Excision of oral tumor      No family history on file.  No Known Allergies  Current Outpatient Prescriptions on File Prior to Visit  Medication Sig Dispense Refill  . allopurinol (ZYLOPRIM) 300 MG tablet Take 300 mg by mouth daily.    . ranitidine (ZANTAC) 150 MG capsule Take 150 mg by mouth daily as needed for heartburn.     No current facility-administered medications on file prior to visit.    BP 151/88 mmHg  Pulse 89  Temp(Src) 98.4 F (36.9 C) (Oral)  Ht 6\' 2"  (1.88 m)  Wt 237 lb (107.502 kg)  BMI 30.42 kg/m2  SpO2 95%   Past Medical History  Diagnosis Date  . Hypertension   . Gout   . Cancer 2006    Kidney Cancer  . Kidney stones     Kidney stones, right  . GERD (gastroesophageal reflux  disease)   . Diverticulosis   . Dry skin   . Difficult intubation     on 04/17/03 [facility] MDA rec: awake or asleep fiberoptic intubation  . Arthritis     History   Social History  . Marital Status: Married    Spouse Name: N/A    Number of Children: N/A  . Years of Education: N/A   Occupational History  . Not on file.   Social History Main Topics  . Smoking status: Former Smoker -- 4 years    Types: Cigarettes    Quit date: 04/07/1976  . Smokeless tobacco: Not on file  . Alcohol Use: Yes     Comment: rare  . Drug Use: No  . Sexual Activity: Not on file   Other Topics Concern  . Not on file   Social History Narrative    Past Surgical History  Procedure Laterality Date  . Partial nephrectomy Left 2006  . Surgery scrotal / testicular    . Ear cyst excision N/A 05/14/2013    Procedure: ENUCLEATION AND CURETTAGE OF ODONTOGENIC CYST FROM THE LEFT ANTERIOR  MAXILLA;  Surgeon: Isac Caddy, DDS;  Location: [place];  Service: Oral Surgery;  Laterality: N/A;  . Tooth extraction N/A 05/14/2013    Procedure: REMOVAL OF SUPERNUMERARY TOOTH NUMBER FIFTY-NINE WITH BONE GRAFTING;  Surgeon: Isac Caddy, DDS;  Location: [place];  Service: Oral Surgery;  Laterality: N/A;  . Nephreectom    . Excision of oral tumor      No family history on file.  No Known Allergies  Current Outpatient Prescriptions on File Prior to Visit  Medication Sig Dispense Refill  . allopurinol (ZYLOPRIM) 300 MG tablet Take 300 mg by mouth daily.    . ranitidine (ZANTAC) 150 MG capsule Take 150 mg by mouth daily as needed for heartburn.     No current facility-administered medications on file prior to visit.    BP 151/88 mmHg  Pulse 89  Temp(Src) 98.4 F (36.9 C) (Oral)  Ht 6\' 2"  (1.88 m)  Wt 237 lb (107.502 kg)  BMI 30.42 kg/m2  SpO2 95%          Review of Systems  Constitutional: Negative for fever, chills and fatigue.  HENT: Negative for congestion, ear discharge, ear pain, nosebleeds, postnasal drip, rhinorrhea, sinus pressure, sneezing, sore throat and trouble swallowing.   Respiratory: Negative for cough, chest tightness, shortness of breath and wheezing.   Cardiovascular: Negative for chest pain and palpitations.  Gastrointestinal: Negative for nausea, abdominal pain, diarrhea and rectal pain.  Musculoskeletal: Negative for back pain.  Neurological: Negative for dizziness, tremors, seizures, syncope, facial asymmetry, weakness, light-headedness, numbness and headaches.  Hematological: Negative for adenopathy. Does not bruise/bleed easily.  Psychiatric/Behavioral: Negative for suicidal ideas, behavioral problems, self-injury and dysphoric mood. The patient is not nervous/anxious.        Objective:   Physical Exam   General Mental Status- Alert. General Appearance- Not in acute distress.   Skin General: Color- Normal Color. Moisture-  Normal Moisture.  Neck Carotid Arteries- Normal color. Moisture- Normal Moisture. No carotid bruits. No JVD.  Chest and Lung Exam Auscultation: Breath Sounds:-Normal.  Cardiovascular Auscultation:Rythm- Regular. Murmurs & Other Heart Sounds:Auscultation of the heart reveals- No Murmurs.  Abdomen Inspection:-Inspeection Normal. Palpation/Percussion:Note:No mass. Palpation and Percussion of the abdomen reveal- Non Tender, Non Distended + BS, no rebound or guarding.   Neurologic Cranial Nerve exam:- CN III-XII intact(No nystagmus), symmetric smile. Romberg Exam:- Negative.  Heal to Toe Gait  exam:-Normal. Strength:- 5/5 equal and symmetric strength both upper and lower extremities.       Assessment & Plan:

## 2014-03-04 NOTE — Progress Notes (Signed)
Pre visit review using our clinic review tool, if applicable. No additional management support is needed unless otherwise documented below in the visit note. 

## 2014-03-04 NOTE — Patient Instructions (Addendum)
Diet, exercise and short term weight loss 5 pounds before wellness exam.  For your gout continue your allopurinol.  For your gerd- continue the rantidine.    Follow up in 1-2 months for complete physical exam fasting.  DASH Eating Plan DASH stands for "Dietary Approaches to Stop Hypertension." The DASH eating plan is a healthy eating plan that has been shown to reduce high blood pressure (hypertension). Additional health benefits may include reducing the risk of type 2 diabetes mellitus, heart disease, and stroke. The DASH eating plan may also help with weight loss. WHAT DO I NEED TO KNOW ABOUT THE DASH EATING PLAN? For the DASH eating plan, you will follow these general guidelines:  Choose foods with a percent daily value for sodium of less than 5% (as listed on the food label).  Use salt-free seasonings or herbs instead of table salt or sea salt.  Check with your health care provider or pharmacist before using salt substitutes.  Eat lower-sodium products, often labeled as "lower sodium" or "no salt added."  Eat fresh foods.  Eat more vegetables, fruits, and low-fat dairy products.  Choose whole grains. Look for the word "whole" as the first word in the ingredient list.  Choose fish and skinless chicken or [place] more often than red meat. Limit fish, poultry, and meat to 6 oz (170 g) each day.  Limit sweets, desserts, sugars, and sugary drinks.  Choose heart-healthy fats.  Limit cheese to 1 oz (28 g) per day.  Eat more home-cooked food and less restaurant, buffet, and fast food.  Limit fried foods.  Cook foods using methods other than frying.  Limit canned vegetables. If you do use them, rinse them well to decrease the sodium.  When eating at a restaurant, ask that your food be prepared with less salt, or no salt if possible. WHAT FOODS CAN I EAT? Seek help from a dietitian for individual calorie needs. Grains Whole grain or whole wheat bread. Brown rice. Whole grain  or whole wheat pasta. Quinoa, bulgur, and whole grain cereals. Low-sodium cereals. Corn or whole wheat flour tortillas. Whole grain cornbread. Whole grain crackers. Low-sodium crackers. Vegetables Fresh or frozen vegetables (raw, steamed, roasted, or grilled). Low-sodium or reduced-sodium tomato and vegetable juices. Low-sodium or reduced-sodium tomato sauce and paste. Low-sodium or reduced-sodium canned vegetables.  Fruits All fresh, canned (in natural juice), or frozen fruits. Meat and Other Protein Products Ground beef (85% or leaner), grass-fed beef, or beef trimmed of fat. Skinless chicken or [place]. Ground chicken or [place]. Pork trimmed of fat. All fish and seafood. Eggs. Dried beans, peas, or lentils. Unsalted nuts and seeds. Unsalted canned beans. Dairy Low-fat dairy products, such as skim or 1% milk, 2% or reduced-fat cheeses, low-fat ricotta or cottage cheese, or plain low-fat yogurt. Low-sodium or reduced-sodium cheeses. Fats and Oils Tub margarines without trans fats. Light or reduced-fat mayonnaise and salad dressings (reduced sodium). Avocado. Safflower, olive, or canola oils. Natural peanut or almond butter. Other Unsalted popcorn and pretzels. The items listed above may not be a complete list of recommended foods or beverages. Contact your dietitian for more options. WHAT FOODS ARE NOT RECOMMENDED? Grains White bread. White pasta. White rice. Refined cornbread. Bagels and croissants. Crackers that contain trans fat. Vegetables Creamed or fried vegetables. Vegetables in a cheese sauce. Regular canned vegetables. Regular canned tomato sauce and paste. Regular tomato and vegetable juices. Fruits Dried fruits. Canned fruit in light or heavy syrup. Fruit juice. Meat and Other Protein Products Fatty cuts of  meat. Ribs, chicken wings, bacon, sausage, bologna, salami, chitterlings, fatback, hot dogs, bratwurst, and packaged luncheon meats. Salted nuts and seeds. Canned beans with  salt. Dairy Whole or 2% milk, cream, half-and-half, and cream cheese. Whole-fat or sweetened yogurt. Full-fat cheeses or blue cheese. Nondairy creamers and whipped toppings. Processed cheese, cheese spreads, or cheese curds. Condiments Onion and garlic salt, seasoned salt, table salt, and sea salt. Canned and packaged gravies. Worcestershire sauce. Tartar sauce. Barbecue sauce. Teriyaki sauce. Soy sauce, including reduced sodium. Steak sauce. Fish sauce. Oyster sauce. Cocktail sauce. Horseradish. Ketchup and mustard. Meat flavorings and tenderizers. Bouillon cubes. Hot sauce. Tabasco sauce. Marinades. Taco seasonings. Relishes. Fats and Oils Butter, stick margarine, lard, shortening, ghee, and bacon fat. Coconut, palm kernel, or palm oils. Regular salad dressings. Other Pickles and olives. Salted popcorn and pretzels. The items listed above may not be a complete list of foods and beverages to avoid. Contact your dietitian for more information. WHERE CAN I FIND MORE INFORMATION? National Heart, Lung, and Blood Institute: travelstabloid.com Document Released: 02/18/2011 Document Revised: 07/16/2013 Document Reviewed: 01/03/2013 [facility] Patient Information 2015 [place], [place]. This information is not intended to replace advice given to you by your health care provider. Make sure you discuss any questions you have with your health care provider.

## 2014-03-04 NOTE — Assessment & Plan Note (Signed)
I do want you to start checking your blood pressure 3 times a week. Would strongly recommend you getting machine and to check your bp so we can truly know if you need bp medication or not. On follow up for your wellness exam please bring in log book. Then may start meds if needed.

## 2014-03-11 ENCOUNTER — Ambulatory Visit (INDEPENDENT_AMBULATORY_CARE_PROVIDER_SITE_OTHER): Payer: 59 | Admitting: Medical

## 2014-03-11 ENCOUNTER — Encounter: Payer: Self-pay | Admitting: Medical

## 2014-03-11 VITALS — BP 150/90 | HR 93 | Temp 97.8°F | Ht 74.0 in | Wt 238.4 lb

## 2014-03-11 DIAGNOSIS — R591 Generalized enlarged lymph nodes: Secondary | ICD-10-CM | POA: Insufficient documentation

## 2014-03-11 DIAGNOSIS — L989 Disorder of the skin and subcutaneous tissue, unspecified: Secondary | ICD-10-CM | POA: Insufficient documentation

## 2014-03-11 DIAGNOSIS — J309 Allergic rhinitis, unspecified: Secondary | ICD-10-CM | POA: Insufficient documentation

## 2014-03-11 DIAGNOSIS — J3089 Other allergic rhinitis: Secondary | ICD-10-CM

## 2014-03-11 DIAGNOSIS — H6120 Impacted cerumen, unspecified ear: Secondary | ICD-10-CM | POA: Insufficient documentation

## 2014-03-11 DIAGNOSIS — H6121 Impacted cerumen, right ear: Secondary | ICD-10-CM

## 2014-03-11 NOTE — Assessment & Plan Note (Signed)
I am not completely convinced this is the case. But this is pt primary concern. I think he may have dermatofibroma vs sebaceous cyst rt axillary area and maybe lipoma in his rt upper thigh. I am going to get cbc and follow pt closely.  If any lymph nodes were to enlarge then would refer to hematologist.

## 2014-03-11 NOTE — Assessment & Plan Note (Signed)
For your  rt ear cerumen impaction, you declined lavage today. You can try debrox otc and after 3-4 days we could lavage the apparent very dry wax.

## 2014-03-11 NOTE — Patient Instructions (Addendum)
I will refer you to  Dermatologist for evaluation of your right axillary area which I think is a dermatofibroma vs sebaceous cyst. I will also have him evaluate your rt upper thigh area to see if he thinks this area may be lipoma.  During the interim, I want to get a cbc. If you feel any other small lumps on any portion of your body please let me know as I am considering referring to hematologist but want to get blood work back and get opinion of the dermatologist.  For pnd get flonase as you may be suffering from mild allergies.  For your  rt ear cerumen impaction, you declined lavage today. You can try debrox otc and after 3-4 days we could lavage the apparent very dry wax.  Follow up in 10-14 days or as needed.  Also please bring [place] copies of body imaging studies as that may be helpful to determine if you have some lipomas or cyst.

## 2014-03-11 NOTE — Progress Notes (Signed)
Subjective:    Patient ID: Brian Sawyer, male    DOB: [birth year], 58 y.o.   MRN: [id number]  HPI   Pt in stating when he was in the other day he had small not in his rt axillary region for 10  Years or more. At first he states was small but 3-4 days ago was bothering him worse.  Recently it has been swollen. Then the other day he thought in his rt groin area he had swollen groin area. He felt this several weeks ago. No prostates symptoms or urinary symptoms.  Also the other day felt some post nasal drainage and some mild rt ear pressure. That feels like it cleared up. Felt like maybe water was in his ear.   Past Medical History  Diagnosis Date  . Hypertension   . Gout   . Cancer 2006    Kidney Cancer  . Kidney stones     Kidney stones, right  . GERD (gastroesophageal reflux disease)   . Diverticulosis   . Dry skin   . Difficult intubation     on 04/17/03 [facility] MDA rec: awake or asleep fiberoptic intubation  . Arthritis     History   Social History  . Marital Status: Married    Spouse Name: N/A    Number of Children: N/A  . Years of Education: N/A   Occupational History  . Not on file.   Social History Main Topics  . Smoking status: Former Smoker -- 4 years    Types: Cigarettes    Quit date: 04/07/1976  . Smokeless tobacco: Not on file  . Alcohol Use: Yes     Comment: rare  . Drug Use: No  . Sexual Activity: Not on file   Other Topics Concern  . Not on file   Social History Narrative    Past Surgical History  Procedure Laterality Date  . Partial nephrectomy Left 2006  . Surgery scrotal / testicular    . Ear cyst excision N/A 05/14/2013    Procedure: 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;  Surgeon: Isac Caddy, DDS;  Location: [place];  Service: Oral Surgery;  Laterality: N/A;  . Tooth extraction N/A 05/14/2013    Procedure: REMOVAL OF SUPERNUMERARY TOOTH NUMBER FIFTY-NINE WITH BONE GRAFTING;  Surgeon:  Isac Caddy, DDS;  Location: [place];  Service: Oral Surgery;  Laterality: N/A;  . Nephreectom    . Excision of oral tumor      No family history on file.  No Known Allergies  Current Outpatient Prescriptions on File Prior to Visit  Medication Sig Dispense Refill  . allopurinol (ZYLOPRIM) 300 MG tablet Take 300 mg by mouth daily.    . hydrochlorothiazide (HYDRODIURIL) 25 MG tablet Take 25 mg by mouth daily.    . ranitidine (ZANTAC) 150 MG capsule Take 150 mg by mouth daily as needed for heartburn.     No current facility-administered medications on file prior to visit.    BP 150/90 mmHg  Pulse 93  Temp(Src) 97.8 F (36.6 C) (Oral)  Ht 6\' 2"  (1.88 m)  Wt 238 lb 6.4 oz (108.138 kg)  BMI 30.60 kg/m2  SpO2 96%        Review of Systems  Constitutional: Negative for fever, chills and fatigue.  HENT: Positive for ear pain and postnasal drip. Negative for congestion, ear discharge, facial swelling, hearing loss, mouth sores, nosebleeds, rhinorrhea, sinus pressure, sneezing, sore throat, trouble swallowing and voice  change.   Respiratory: Negative for cough, choking, chest tightness and wheezing.   Cardiovascular: Negative for chest pain and palpitations.  Gastrointestinal: Negative.   Genitourinary: Negative for dysuria, urgency, frequency, hematuria, flank pain, decreased urine volume, discharge, penile swelling, scrotal swelling, difficulty urinating, genital sores, penile pain and testicular pain.  Musculoskeletal: Negative for myalgias, back pain, arthralgias, gait problem, neck pain and neck stiffness.  Skin:       Rt axillary skin concern and rt upper thigh concern.  Neurological: Negative for dizziness, tremors, seizures, syncope, facial asymmetry, speech difficulty, weakness, light-headedness, numbness and headaches.  Psychiatric/Behavioral: Negative for suicidal ideas, behavioral problems, confusion, sleep disturbance, self-injury, dysphoric mood, decreased  concentration and agitation. The patient is not nervous/anxious and is not hyperactive.        Objective:   Physical Exam   General- No acute distress. Pleasant patient. Neck- Full range of motion, no jvd Lungs- Clear, even and unlabored. Heart- regular rate and rhythm. Neurologic- CNII- XII grossly intact. Lymph nodes-  Rt axillary area- small 3 mm raised area. Axillary area sebacious cyst vs dermatofibroma. No redness, no warmth or tenderness.  Rt upper medial thigh- 2 cm oblong lipom like feel.  Skin- scattered small moles all over.  HEENT- exam negative except rt ear has a lot of dry wax. Can't see tm.        Assessment & Plan:

## 2014-03-11 NOTE — Assessment & Plan Note (Signed)
For pnd get flonase as you may be suffering from mild allergies.

## 2014-03-11 NOTE — Progress Notes (Signed)
Pre visit review using our clinic review tool, if applicable. No additional management support is needed unless otherwise documented below in the visit note. 

## 2014-03-11 NOTE — Assessment & Plan Note (Signed)
I will refer you to  Dermatologist for evaluation of your right axillary area which I think is a dermatofibroma vs sebaceous cyst. I will also have him evaluate your rt upper thigh area to see if he thinks this area may be lipoma.

## 2014-03-12 LAB — CBC WITH DIFFERENTIAL/PLATELET
Basophils Absolute: 0 10*3/uL (ref 0.0–0.1)
Basophils Relative: 0.6 % (ref 0.0–3.0)
Eosinophils Absolute: 0.2 10*3/uL (ref 0.0–0.7)
Eosinophils Relative: 2.8 % (ref 0.0–5.0)
HCT: 43.9 % (ref 39.0–52.0)
Hemoglobin: 14.6 g/dL (ref 13.0–17.0)
LYMPHS PCT: 28.8 % (ref 12.0–46.0)
Lymphs Abs: 2 10*3/uL (ref 0.7–4.0)
MCHC: 33.2 g/dL (ref 30.0–36.0)
MCV: 88.2 fl (ref 78.0–100.0)
Monocytes Absolute: 0.4 10*3/uL (ref 0.1–1.0)
Monocytes Relative: 5.7 % (ref 3.0–12.0)
Neutro Abs: 4.3 10*3/uL (ref 1.4–7.7)
Neutrophils Relative %: 62.1 % (ref 43.0–77.0)
Platelets: 253 10*3/uL (ref 150.0–400.0)
RBC: 4.97 Mil/uL (ref 4.22–5.81)
RDW: 14.3 % (ref 11.5–15.5)
WBC: 7 10*3/uL (ref 4.0–10.5)

## 2014-03-13 ENCOUNTER — Encounter: Payer: Self-pay | Admitting: Medical

## 2014-03-13 ENCOUNTER — Telehealth: Payer: Self-pay | Admitting: Medical

## 2014-03-13 NOTE — Telephone Encounter (Signed)
Called patient back after reviewing CBC/DIff everything within normal limits. Patient advised.

## 2014-03-13 NOTE — Telephone Encounter (Signed)
Caller name:Beaupre, Iona Beard Relation to TD:HRCB Call back number:[phone] Pharmacy:  Reason for call: pt is wanting to know his results from labs that were done on 03/11/14 states he went on his my chart and the results are not there.

## 2014-03-14 ENCOUNTER — Other Ambulatory Visit: Payer: Self-pay | Admitting: Dermatology

## 2014-03-21 ENCOUNTER — Ambulatory Visit (INDEPENDENT_AMBULATORY_CARE_PROVIDER_SITE_OTHER): Payer: 59 | Admitting: Medical

## 2014-03-21 ENCOUNTER — Encounter: Payer: Self-pay | Admitting: Medical

## 2014-03-21 VITALS — BP 146/85 | HR 90 | Temp 98.1°F | Ht 74.0 in | Wt 236.6 lb

## 2014-03-21 DIAGNOSIS — H6121 Impacted cerumen, right ear: Secondary | ICD-10-CM

## 2014-03-21 DIAGNOSIS — J3089 Other allergic rhinitis: Secondary | ICD-10-CM

## 2014-03-21 MED ORDER — AZITHROMYCIN 250 MG PO TABS: ORAL_TABLET | ORAL | Status: DC

## 2014-03-21 MED ORDER — FLUTICASONE PROPIONATE 50 MCG/ACT NA SUSP: 2.0000 | Freq: Every day | NASAL | Status: DC

## 2014-03-21 MED ORDER — LORATADINE 10 MG PO TABS: 10.0000 mg | ORAL_TABLET | Freq: Every day | ORAL | Status: DC

## 2014-03-21 NOTE — Progress Notes (Signed)
Pre visit review using our clinic review tool, if applicable. No additional management support is needed unless otherwise documented below in the visit note. 

## 2014-03-21 NOTE — Assessment & Plan Note (Addendum)
We removed about 70% of the wax but pt have good bit over membrane that won't come out. If pt want [place] to refer you to ENT just let us know and will do so.

## 2014-03-21 NOTE — Progress Notes (Signed)
Subjective:    Patient ID: Brian Sawyer, male    DOB: 06/19/1955, 59 y.o.   MRN: [id number]  HPI   Pt in for wax in rt ear. He has been using debrox. He had severe dry wax on last visit.  Also now one week of pnd and occasional intermittent frontal sinus pressure but not today. No fever, no chills. No cough. No sneezing. No itchy eyes. When he clears his throat in this am some mild mucous.   Pt states dermatologist agreed with my thoughts regarding his skin lesions Basically per pt all were benign.  Past Medical History  Diagnosis Date  . Hypertension   . Gout   . Cancer 2006    Kidney Cancer  . Kidney stones     Kidney stones, right  . GERD (gastroesophageal reflux disease)   . Diverticulosis   . Dry skin   . Difficult intubation     on 04/17/03 [facility] MDA rec: awake or asleep fiberoptic intubation  . Arthritis     History   Social History  . Marital Status: Married    Spouse Name: N/A    Number of Children: N/A  . Years of Education: N/A   Occupational History  . Not on file.   Social History Main Topics  . Smoking status: Former Smoker -- 4 years    Types: Cigarettes    Quit date: 04/07/1976  . Smokeless tobacco: Not on file  . Alcohol Use: Yes     Comment: rare  . Drug Use: No  . Sexual Activity: Not on file   Other Topics Concern  . Not on file   Social History Narrative    Past Surgical History  Procedure Laterality Date  . Partial nephrectomy Left 2006  . Surgery scrotal / testicular    . Ear cyst excision N/A 05/14/2013    Procedure: 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;  Surgeon: Isac Caddy, DDS;  Location: [place];  Service: Oral Surgery;  Laterality: N/A;  . Tooth extraction N/A 05/14/2013    Procedure: REMOVAL OF SUPERNUMERARY TOOTH NUMBER FIFTY-NINE WITH BONE GRAFTING;  Surgeon: Isac Caddy, DDS;  Location: [place];  Service: Oral Surgery;  Laterality: N/A;  . Nephreectom    . Excision  of oral tumor      No family history on file.  No Known Allergies  Current Outpatient Prescriptions on File Prior to Visit  Medication Sig Dispense Refill  . allopurinol (ZYLOPRIM) 300 MG tablet Take 300 mg by mouth daily.    . hydrochlorothiazide (HYDRODIURIL) 25 MG tablet Take 25 mg by mouth daily.    . ranitidine (ZANTAC) 150 MG capsule Take 150 mg by mouth daily as needed for heartburn.     No current facility-administered medications on file prior to visit.    BP 146/85 mmHg  Pulse 90  Temp(Src) 98.1 F (36.7 C) (Oral)  Ht 6\' 2"  (1.88 m)  Wt 236 lb 9.6 oz (107.321 kg)  BMI 30.36 kg/m2  SpO2 95%      Review of Systems  Constitutional: Negative for fever, chills and fatigue.  HENT: Positive for ear pain, postnasal drip and sinus pressure. Negative for congestion, ear discharge, nosebleeds, rhinorrhea, sneezing, sore throat, tinnitus and voice change.        Rt ear hearing decreased + blocked sensation. Transient intermittent sinus pressure.  Respiratory: Negative for cough, chest tightness, shortness of breath and wheezing.   Cardiovascular: Negative for  chest pain and palpitations.  Neurological: Negative for dizziness, weakness, light-headedness, numbness and headaches.  Hematological: Negative for adenopathy. Does not bruise/bleed easily.       Objective:   Physical Exam   General  Mental Status - Alert. General Appearance - Well groomed. Not in acute distress.  Skin Rashes- No Rashes.  HEENT Head- Normal. Ear Auditory Canal - Left- Normal. Right - A lot of wax on the rt side. Looks little softer today than last time(Post lavage 70% approx wax removed. I tried quite a long time to remove with currette but only small pieces of wax removed each attempt.Tympanic Membrane- Left- Normal. Right- Could not see tm. Eye Sclera/Conjunctiva- Left- Normal. Right- Normal. Nose & Sinuses Nasal Mucosa- Left-  Mild  Boggy+  Congested. Right-  Mild boggy + Congested. No  frontal or maxillary sinus pressure Mouth & Throat Lips: Upper Lip- Normal: no dryness, cracking, pallor, cyanosis, or vesicular eruption. Lower Lip-Normal: no dryness, cracking, pallor, cyanosis or vesicular eruption. Buccal Mucosa- Bilateral- No Aphthous ulcers. Oropharynx- No Discharge or Erythema. But +pnd. Tonsils: Characteristics- Bilateral- No Erythema or Congestion. Size/Enlargement- Bilateral- No enlargement. Discharge- bilateral-None.  Neck Neck- Supple. No Masses.   Chest and Lung Exam Auscultation: Breath Sounds:- even and unlabored,  Cardiovascular Auscultation:Rythm- Regular, rate and rhythm. Murmurs & Other Heart Sounds:Ausculatation of the heart reveal- No Murmurs.  Lymphatic Head & Neck General Head & Neck Lymphatics: Bilateral: Description- No Localized lymphadenopathy.         Assessment & Plan:

## 2014-03-21 NOTE — Assessment & Plan Note (Signed)
You may have allergies vs uri causing pnd and intermitent frontal sinus pressure. I am writing flonase nasal steroid and get claritin otc. If you get constant sinus pressure or more productive mucous then start azithromycin.

## 2014-03-21 NOTE — Patient Instructions (Addendum)
We removed about 70% of the wax but pt have good bit over membrane that won't come out. If pt want [place] to refer you to ENT just let us know and will do so.  You may have allergies vs uri causing pnd and intermitent frontal sinus pressure. I am writing flonase nasal steroid and get claritin otc. If you get constant sinus pressure or more productive mucous then start azithromycin.  Follow up for Physical exam or as needed

## 2014-03-25 ENCOUNTER — Telehealth: Payer: Self-pay | Admitting: Medical

## 2014-03-25 DIAGNOSIS — H6122 Impacted cerumen, left ear: Secondary | ICD-10-CM

## 2014-03-25 NOTE — Telephone Encounter (Signed)
Refer to ent for cerumen impaction.

## 2014-03-25 NOTE — Telephone Encounter (Signed)
Pt needs ENT referral-

## 2014-04-03 ENCOUNTER — Encounter: Payer: Self-pay | Admitting: Medical

## 2014-04-03 ENCOUNTER — Ambulatory Visit (INDEPENDENT_AMBULATORY_CARE_PROVIDER_SITE_OTHER): Payer: 59 | Admitting: Medical

## 2014-04-03 VITALS — BP 129/87 | HR 77 | Temp 97.5°F | Ht 74.0 in | Wt 234.4 lb

## 2014-04-03 DIAGNOSIS — Z23 Encounter for immunization: Secondary | ICD-10-CM

## 2014-04-03 DIAGNOSIS — Z0189 Encounter for other specified special examinations: Secondary | ICD-10-CM

## 2014-04-03 DIAGNOSIS — Z0000 Encounter for general adult medical examination without abnormal findings: Secondary | ICD-10-CM

## 2014-04-03 DIAGNOSIS — Z125 Encounter for screening for malignant neoplasm of prostate: Secondary | ICD-10-CM

## 2014-04-03 DIAGNOSIS — Z1211 Encounter for screening for malignant neoplasm of colon: Secondary | ICD-10-CM

## 2014-04-03 LAB — CBC WITH DIFFERENTIAL/PLATELET
Basophils Absolute: 0 10*3/uL (ref 0.0–0.1)
Basophils Relative: 0.3 % (ref 0.0–3.0)
EOS PCT: 5.3 % — AB (ref 0.0–5.0)
Eosinophils Absolute: 0.3 10*3/uL (ref 0.0–0.7)
HEMATOCRIT: 44.3 % (ref 39.0–52.0)
Hemoglobin: 15.1 g/dL (ref 13.0–17.0)
LYMPHS ABS: 2.6 10*3/uL (ref 0.7–4.0)
Lymphocytes Relative: 41.4 % (ref 12.0–46.0)
MCHC: 33.9 g/dL (ref 30.0–36.0)
MCV: 86.2 fl (ref 78.0–100.0)
Monocytes Absolute: 0.4 10*3/uL (ref 0.1–1.0)
Monocytes Relative: 6.8 % (ref 3.0–12.0)
Neutro Abs: 2.9 10*3/uL (ref 1.4–7.7)
Neutrophils Relative %: 46.2 % (ref 43.0–77.0)
Platelets: 236 10*3/uL (ref 150.0–400.0)
RBC: 5.15 Mil/uL (ref 4.22–5.81)
RDW: 14.6 % (ref 11.5–15.5)
WBC: 6.2 10*3/uL (ref 4.0–10.5)

## 2014-04-03 LAB — PSA: PSA: 0.39 ng/mL (ref 0.10–4.00)

## 2014-04-03 LAB — LIPID PANEL
CHOLESTEROL: 267 mg/dL — AB (ref 0–200)
HDL: 31.1 mg/dL — ABNORMAL LOW (ref >39.00)
NonHDL: 235.9
Total CHOL/HDL Ratio: 9
Triglycerides: 294 mg/dL — ABNORMAL HIGH (ref 0.0–149.0)
VLDL: 58.8 mg/dL — ABNORMAL HIGH (ref 0.0–40.0)

## 2014-04-03 LAB — COMPREHENSIVE METABOLIC PANEL
ALBUMIN: 4.9 g/dL (ref 3.5–5.2)
ALK PHOS: 104 U/L (ref 39–117)
ALT: 26 U/L (ref 0–53)
AST: 39 U/L — ABNORMAL HIGH (ref 0–37)
BILIRUBIN TOTAL: 0.9 mg/dL (ref 0.2–1.2)
BUN: 17 mg/dL (ref 6–23)
CALCIUM: 9.7 mg/dL (ref 8.4–10.5)
CO2: 29 meq/L (ref 19–32)
Chloride: 102 mEq/L (ref 96–112)
Creatinine, Ser: 0.91 mg/dL (ref 0.40–1.50)
GFR: 90.8 mL/min (ref >60.00)
Glucose, Bld: 113 mg/dL — ABNORMAL HIGH (ref 70–99)
POTASSIUM: 4 meq/L (ref 3.5–5.1)
SODIUM: 137 meq/L (ref 135–145)
Total Protein: 7.7 g/dL (ref 6.0–8.3)

## 2014-04-03 LAB — TSH: TSH: 1.95 u[IU]/mL (ref 0.35–4.50)

## 2014-04-03 LAB — LDL CHOLESTEROL, DIRECT: Direct LDL: 182 mg/dL

## 2014-04-03 MED ORDER — KETOROLAC TROMETHAMINE 60 MG/2ML IM SOLN: 60.0000 mg | Freq: Once | INTRAMUSCULAR | Status: AC

## 2014-04-03 NOTE — Progress Notes (Signed)
Subjective:    Patient ID: Brian Sawyer, male    DOB: 06/13/1955, 59 y.o.   MRN: [id number]  HPI    Pt in for complete physical exam.  Pt per work needs lipid panel, cmp, height, weight, bp. Pt work form is not ready.  Pt is fasting.  Pt is eating little better recently. He is trying to exercise. He last 3-4 pounds recently. Non smoker. Rare alcohol. Caffeine about 1 cup 4 times a week.  Pt states 2005 colonoscopy. Some diverticulitis found on that per pt. No repeat since then.   Pt see urologist each year. Pt sees in about a year. Maybe saw him this November. Hx of kidney cancer. He will still see him in one year.  Pt unable to give urine today.  Pt not up to date tetanus.   Past Medical History  Diagnosis Date  . Hypertension   . Gout   . Cancer 2006    Kidney Cancer  . Kidney stones     Kidney stones, right  . GERD (gastroesophageal reflux disease)   . Diverticulosis   . Dry skin   . Difficult intubation     on 04/17/03 [facility] MDA rec: awake or asleep fiberoptic intubation  . Arthritis     History   Social History  . Marital Status: Married    Spouse Name: N/A    Number of Children: N/A  . Years of Education: N/A   Occupational History  . Not on file.   Social History Main Topics  . Smoking status: Former Smoker -- 4 years    Types: Cigarettes    Quit date: 04/07/1976  . Smokeless tobacco: Not on file  . Alcohol Use: Yes     Comment: rare  . Drug Use: No  . Sexual Activity: Not on file   Other Topics Concern  . Not on file   Social History Narrative    Past Surgical History  Procedure Laterality Date  . Partial nephrectomy Left 2006  . Surgery scrotal / testicular    . 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 Tooth extraction N/A 05/14/2013    Procedure:  REMOVAL OF SUPERNUMERARY TOOTH NUMBER FIFTY-NINE WITH BONE GRAFTING;  Surgeon: Isac Caddy, DDS;  Location: [place];  Service: Oral Surgery;  Laterality: N/A;  . Nephreectom    . Excision of oral tumor      No family history on file.  No Known Allergies  Current Outpatient Prescriptions on File Prior to Visit  Medication Sig Dispense Refill  . allopurinol (ZYLOPRIM) 300 MG tablet Take 300 mg by mouth daily.    . fluticasone (FLONASE) 50 MCG/ACT nasal spray Place 2 sprays into both nostrils daily. 16 g 1  . hydrochlorothiazide (HYDRODIURIL) 25 MG tablet Take 25 mg by mouth daily.    Marland Kitchen loratadine (CLARITIN) 10 MG tablet Take 1 tablet (10 mg total) by mouth daily. 30 tablet 0  . ranitidine (ZANTAC) 150 MG capsule Take 150 mg by mouth daily as needed for heartburn.     No current facility-administered medications on file prior to visit.    BP 129/87 mmHg  Pulse 77  Temp(Src) 97.5 F (36.4 C) (Oral)  Ht 6\' 2"  (1.88 m)  Wt 234 lb 6.4 oz (106.323 kg)  BMI 30.08 kg/m2  SpO2 97%             Review of Systems  Constitutional: Negative for fever, chills, diaphoresis, activity change and fatigue.  HENT: Negative for congestion, ear pain, facial swelling, mouth sores, nosebleeds, postnasal drip, sinus pressure, sneezing, sore throat and tinnitus.        Some rt ear wax recently. Some wax obstructing ear canal.  Eyes: Negative for pain.  Respiratory: Negative for cough, chest tightness and shortness of breath.   Cardiovascular: Negative for chest pain, palpitations and leg swelling.  Gastrointestinal: Negative for nausea, vomiting and abdominal pain.  Genitourinary: Negative for flank pain.  Musculoskeletal: Negative for back pain, neck pain and neck stiffness.  Skin: Negative for pallor and rash.  Neurological: Negative for dizziness, tremors, seizures, syncope, facial asymmetry, speech difficulty, weakness, light-headedness, numbness and headaches.  Hematological:  Negative for adenopathy. Does not bruise/bleed easily.  Psychiatric/Behavioral: Negative for behavioral problems, confusion and agitation. The patient is not nervous/anxious.        Objective:   Physical Exam  General Mental Status- Alert. Orientation- Oriented x3.  Build and Nutrition- Well nourished and Well Developed.  Skin General:-Normal. Color- Normal color. Moisture- Normal. Temperature-Warm. Recently saw derm as well.  HEENT  Ears- Normal. Auditory Canal- Bilateral-Normal. Tympanic Membrane- Bilateral-Normal. Eye Fundi-Bilateral-Normal. Pupil- bilateral- Direct reaction to light normal. Nose & Sinuses- Normal. Nostrils-Bilateral- Normal. Mouth & Throat-Normal.  Neck Neck- No Bruits or Masses. Trachea midline.  Thyroid- Normal.  Chest and Lung Exam Percussion: Quality and Intensity-Percussion normal. Percussion of the chest reveals- No Dullness.  Palpation: Palpation of the chest reveals- Non-tender- No dullness. Auscultation: Breath Sounds- Normal.  Adventitous Sounds:-No adventitious sounds.  Cardiovascular Inspection:- No Heaves. Auscultation:-Normal sinus rhythm without murmur gallop, S1 WNL and S2 WNL.  Abdomen Inspection:-Inspection Normal. Inspection of the abdomen reveals- No hernias Palpation/Percussion:- Palpation and Percussion of the Abdomen reveal- Non Tender and No Palpable abdominal masses. Liver: Other Characteristics- No hepatomegaly. Spleen:Other Characteristics- No Splenomegaly. Auscultation:- Auscultation of the abdomen reveals- Bowel sounds normal and No Abdominal bruits.  Male Genitourinary Did not repeat. Recently checked.  Rectal Anorectal Exam: Performed- Normal sphincter tone. No masses noted. Prostate smooth normal size. Stool HEME Negative.  Peripheral  Vascular Lower Extremity:Inspection- Bilateral-Inspection Normal  Palpation: Femoral pulse- Bilateral- 2+. Popliteal pulse- Bilateral-2+. Dorsalis pedis pulse- Bilateral- 1/2+.  Edema- Bilateral- No edema.  Neurologic Mental Status:- Normal. Cranial Nerves:-Normal Bilaterally. Motor:-Normal. Strength:5/5 normal muscle strength-All Muscles. General Assessment of Reflexes: Right Knee-2+. Left Knee- 2+. Coordination-Normal. Gait- Normal.  Meningeal Signs- None.  Musculoskeletal Global Assessment General-Joints show full range of motion without obvious deformity and Normal muscle mass. Strength in upper and lower extremities.  Lymphatics General lymphatics Description- No generalized lymphadenopathy.       Assessment & Plan:

## 2014-04-03 NOTE — Patient Instructions (Signed)
We will get laps today for physical. Since you can't give ua today still follow up with urology on annual basis due to your history.  When you get work form we can fill it out.  I will schedule colonoscopy.  We gave you tdap today.  Follow up 1 yr physical or as needed  Preventive Care for Adults A healthy lifestyle and preventive care can promote health and wellness. Preventive health guidelines for men include the following key practices:  A routine yearly physical is a good way to check with your health care provider about your health and preventative screening. It is a chance to share any concerns and updates on your health and to receive a thorough exam.  Visit your dentist for a routine exam and preventative care every 6 months. Brush your teeth twice a day and floss once a day. Good oral hygiene prevents tooth decay and gum disease.  The frequency of eye exams is based on your age, health, family medical history, use of contact lenses, and other factors. Follow your health care provider's recommendations for frequency of eye exams.  Eat a healthy diet. Foods such as vegetables, fruits, whole grains, low-fat dairy products, and lean protein foods contain the nutrients you need without too many calories. Decrease your intake of foods high in solid fats, added sugars, and salt. Eat the right amount of calories for you.Get information about a proper diet from your health care provider, if necessary.  Regular physical exercise is one of the most important things you can do for your health. Most adults should get at least 150 minutes of moderate-intensity exercise (any activity that increases your heart rate and causes you to sweat) each week. In addition, most adults need muscle-strengthening exercises on 2 or more days a week.  Maintain a healthy weight. The body mass index (BMI) is a screening tool to identify possible weight problems. It provides an estimate of body fat based on height  and weight. Your health care provider can find your BMI and can help you achieve or maintain a healthy weight.For adults 20 years and older:  A BMI below 18.5 is considered underweight.  A BMI of 18.5 to 24.9 is normal.  A BMI of 25 to 29.9 is considered overweight.  A BMI of 30 and above is considered obese.  Maintain normal blood lipids and cholesterol levels by exercising and minimizing your intake of saturated fat. Eat a balanced diet with plenty of fruit and vegetables. Blood tests for lipids and cholesterol should begin at age 38 and be repeated every 5 years. If your lipid or cholesterol levels are high, you are over 50, or you are at high risk for heart disease, you may need your cholesterol levels checked more frequently.Ongoing high lipid and cholesterol levels should be treated with medicines if diet and exercise are not working.  If you smoke, find out from your health care provider how to quit. If you do not use tobacco, do not start.  Lung cancer screening is recommended for adults aged 98-80 years who are at high risk for developing lung cancer because of a history of smoking. A yearly low-dose CT scan of the lungs is recommended for people who have at least a 30-pack-year history of smoking and are a current smoker or have quit within the past 15 years. A pack year of smoking is smoking an average of 1 pack of cigarettes a day for 1 year (for example: 1 pack a day for 30  years or 2 packs a day for 15 years). Yearly screening should continue until the smoker has stopped smoking for at least 15 years. Yearly screening should be stopped for people who develop a health problem that would prevent them from having lung cancer treatment.  If you choose to drink alcohol, do not have more than 2 drinks per day. One drink is considered to be 12 ounces (355 mL) of beer, 5 ounces (148 mL) of wine, or 1.5 ounces (44 mL) of liquor.  Avoid use of street drugs. Do not share needles with anyone.  Ask for help if you need support or instructions about stopping the use of drugs.  High blood pressure causes heart disease and increases the risk of stroke. Your blood pressure should be checked at least every 1-2 years. Ongoing high blood pressure should be treated with medicines, if weight loss and exercise are not effective.  If you are 44-4 years old, ask your health care provider if you should take aspirin to prevent heart disease.  Diabetes screening involves taking a blood sample to check your fasting blood sugar level. This should be done once every 3 years, after age 4, if you are within normal weight and without risk factors for diabetes. Testing should be considered at a younger age or be carried out more frequently if you are overweight and have at least 1 risk factor for diabetes.  Colorectal cancer can be detected and often prevented. Most routine colorectal cancer screening begins at the age of 48 and continues through age 79. However, your health care provider may recommend screening at an earlier age if you have risk factors for colon cancer. On a yearly basis, your health care provider may provide home test kits to check for hidden blood in the stool. Use of a small camera at the end of a tube to directly examine the colon (sigmoidoscopy or colonoscopy) can detect the earliest forms of colorectal cancer. Talk to your health care provider about this at age 80, when routine screening begins. Direct exam of the colon should be repeated every 5-10 years through age 26, unless early forms of precancerous polyps or small growths are found.  People who are at an increased risk for hepatitis B should be screened for this virus. You are considered at high risk for hepatitis B if:  You were born in a country where hepatitis B occurs often. Talk with your health care provider about which countries are considered high risk.  Your parents were born in a high-risk country and you have not  received a shot to protect against hepatitis B (hepatitis B vaccine).  You have HIV or AIDS.  You use needles to inject street drugs.  You live with, or have sex with, someone who has hepatitis B.  You are a man who has sex with other men (MSM).  You get hemodialysis treatment.  You take certain medicines for conditions such as cancer, organ transplantation, and autoimmune conditions.  Hepatitis C blood testing is recommended for all people born from [birth year] through 1965 and any individual with known risks for hepatitis C.  Practice safe sex. Use condoms and avoid high-risk sexual practices to reduce the spread of sexually transmitted infections (STIs). STIs include gonorrhea, chlamydia, syphilis, trichomonas, herpes, HPV, and human immunodeficiency virus (HIV). Herpes, HIV, and HPV are viral illnesses that have no cure. They can result in disability, cancer, and death.  If you are at risk of being infected with HIV, it is recommended  that you take a prescription medicine daily to prevent HIV infection. This is called preexposure prophylaxis (PrEP). You are considered at risk if:  You are a man who has sex with other men (MSM) and have other risk factors.  You are a heterosexual man, are sexually active, and are at increased risk for HIV infection.  You take drugs by injection.  You are sexually active with a partner who has HIV.  Talk with your health care provider about whether you are at high risk of being infected with HIV. If you choose to begin PrEP, you should first be tested for HIV. You should then be tested every 3 months for as long as you are taking PrEP.  A one-time screening for abdominal aortic aneurysm (AAA) and surgical repair of large AAAs by ultrasound are recommended for men ages 26 to 33 years who are current or former smokers.  Healthy men should no longer receive prostate-specific antigen (PSA) blood tests as part of routine cancer screening. Talk with your health  care provider about prostate cancer screening.  Testicular cancer screening is not recommended for adult males who have no symptoms. Screening includes self-exam, a health care provider exam, and other screening tests. Consult with your health care provider about any symptoms you have or any concerns you have about testicular cancer.  Use sunscreen. Apply sunscreen liberally and repeatedly throughout the day. You should seek shade when your shadow is shorter than you. Protect yourself by wearing long sleeves, pants, a wide-brimmed hat, and sunglasses year round, whenever you are outdoors.  Once a month, do a whole-body skin exam, using a mirror to look at the skin on your back. Tell your health care provider about new moles, moles that have irregular borders, moles that are larger than a pencil eraser, or moles that have changed in shape or color.  Stay current with required vaccines (immunizations).  Influenza vaccine. All adults should be immunized every year.  Tetanus, diphtheria, and acellular pertussis (Td, Tdap) vaccine. An adult who has not previously received Tdap or who does not know his vaccine status should receive 1 dose of Tdap. This initial dose should be followed by tetanus and diphtheria toxoids (Td) booster doses every 10 years. Adults with an unknown or incomplete history of completing a 3-dose immunization series with Td-containing vaccines should begin or complete a primary immunization series including a Tdap dose. Adults should receive a Td booster every 10 years.  Varicella vaccine. An adult without evidence of immunity to varicella should receive 2 doses or a second dose if he has previously received 1 dose.  Human papillomavirus (HPV) vaccine. Males aged 9-21 years who have not received the vaccine previously should receive the 3-dose series. Males aged 22-26 years may be immunized. Immunization is recommended through the age of 3 years for any male who has sex with males  and did not get any or all doses earlier. Immunization is recommended for any person with an immunocompromised condition through the age of 43 years if he did not get any or all doses earlier. During the 3-dose series, the second dose should be obtained 4-8 weeks after the first dose. The third dose should be obtained 24 weeks after the first dose and 16 weeks after the second dose.  Zoster vaccine. One dose is recommended for adults aged 64 years or older unless certain conditions are present.  Measles, mumps, and rubella (MMR) vaccine. Adults born before [birth year] generally are considered immune to measles and mumps.  Adults born in [birth year] or later should have 1 or more doses of MMR vaccine unless there is a contraindication to the vaccine or there is laboratory evidence of immunity to each of the three diseases. A routine second dose of MMR vaccine should be obtained at least 28 days after the first dose for students attending postsecondary schools, health care workers, or international travelers. People who received inactivated measles vaccine or an unknown type of measles vaccine during 1963-1967 should receive 2 doses of MMR vaccine. People who received inactivated mumps vaccine or an unknown type of mumps vaccine before 1979 and are at high risk for mumps infection should consider immunization with 2 doses of MMR vaccine. Unvaccinated health care workers born before 65 who lack laboratory evidence of measles, mumps, or rubella immunity or laboratory confirmation of disease should consider measles and mumps immunization with 2 doses of MMR vaccine or rubella immunization with 1 dose of MMR vaccine.  Pneumococcal 13-valent conjugate (PCV13) vaccine. When indicated, a person who is uncertain of his immunization history and has no record of immunization should receive the PCV13 vaccine. An adult aged 41 years or older who has certain medical conditions and has not been previously immunized should receive 1 dose  of PCV13 vaccine. This PCV13 should be followed with a dose of pneumococcal polysaccharide (PPSV23) vaccine. The PPSV23 vaccine dose should be obtained at least 8 weeks after the dose of PCV13 vaccine. An adult aged 3 years or older who has certain medical conditions and previously received 1 or more doses of PPSV23 vaccine should receive 1 dose of PCV13. The PCV13 vaccine dose should be obtained 1 or more years after the last PPSV23 vaccine dose.  Pneumococcal polysaccharide (PPSV23) vaccine. When PCV13 is also indicated, PCV13 should be obtained first. All adults aged 57 years and older should be immunized. An adult younger than age 49 years who has certain medical conditions should be immunized. Any person who resides in a nursing home or long-term care facility should be immunized. An adult smoker should be immunized. People with an immunocompromised condition and certain other conditions should receive both PCV13 and PPSV23 vaccines. People with human immunodeficiency virus (HIV) infection should be immunized as soon as possible after diagnosis. Immunization during chemotherapy or radiation therapy should be avoided. Routine use of PPSV23 vaccine is not recommended for American Indians, [place] Natives, or people younger than 65 years unless there are medical conditions that require PPSV23 vaccine. When indicated, people who have unknown immunization and have no record of immunization should receive PPSV23 vaccine. One-time revaccination 5 years after the first dose of PPSV23 is recommended for people aged 19-64 years who have chronic kidney failure, nephrotic syndrome, asplenia, or immunocompromised conditions. People who received 1-2 doses of PPSV23 before age 50 years should receive another dose of PPSV23 vaccine at age 32 years or later if at least 5 years have passed since the previous dose. Doses of PPSV23 are not needed for people immunized with PPSV23 at or after age 40 years.  Meningococcal  vaccine. Adults with asplenia or persistent complement component deficiencies should receive 2 doses of quadrivalent meningococcal conjugate (MenACWY-D) vaccine. The doses should be obtained at least 2 months apart. Microbiologists working with certain meningococcal bacteria, [employer] recruits, people at risk during an outbreak, and people who travel to or live in countries with a high rate of meningitis should be immunized. A first-year college student up through age 69 years who is living in a residence hall should receive  a dose if he did not receive a dose on or after his 16th birthday. Adults who have certain high-risk conditions should receive one or more doses of vaccine.  Hepatitis A vaccine. Adults who wish to be protected from this disease, have certain high-risk conditions, work with hepatitis A-infected animals, work in hepatitis A research labs, or travel to or work in countries with a high rate of hepatitis A should be immunized. Adults who were previously unvaccinated and who anticipate close contact with an international adoptee during the first 60 days after arrival in the [place] from a country with a high rate of hepatitis A should be immunized.  Hepatitis B vaccine. Adults should be immunized if they wish to be protected from this disease, have certain high-risk conditions, may be exposed to blood or other infectious body fluids, are household contacts or sex partners of hepatitis B positive people, are clients or workers in certain care facilities, or travel to or work in countries with a high rate of hepatitis B.  Haemophilus influenzae type b (Hib) vaccine. A previously unvaccinated person with asplenia or sickle cell disease or having a scheduled splenectomy should receive 1 dose of Hib vaccine. Regardless of previous immunization, a recipient of a hematopoietic stem cell transplant should receive a 3-dose series 6-12 months after his successful transplant. Hib vaccine is not  recommended for adults with HIV infection. Preventive Service / Frequency Ages 69 to 52  Blood pressure check.** / Every 1 to 2 years.  Lipid and cholesterol check.** / Every 5 years beginning at age 37.  Hepatitis C blood test.** / For any individual with known risks for hepatitis C.  Skin self-exam. / Monthly.  Influenza vaccine. / Every year.  Tetanus, diphtheria, and acellular pertussis (Tdap, Td) vaccine.** / Consult your health care provider. 1 dose of Td every 10 years.  Varicella vaccine.** / Consult your health care provider.  HPV vaccine. / 3 doses over 6 months, if 96 or younger.  Measles, mumps, rubella (MMR) vaccine.** / You need at least 1 dose of MMR if you were born in 1957 or later. You may also need a second dose.  Pneumococcal 13-valent conjugate (PCV13) vaccine.** / Consult your health care provider.  Pneumococcal polysaccharide (PPSV23) vaccine.** / 1 to 2 doses if you smoke cigarettes or if you have certain conditions.  Meningococcal vaccine.** / 1 dose if you are age 36 to 50 years and a Market researcher living in a residence hall, or have one of several medical conditions. You may also need additional booster doses.  Hepatitis A vaccine.** / Consult your health care provider.  Hepatitis B vaccine.** / Consult your health care provider.  Haemophilus influenzae type b (Hib) vaccine.** / Consult your health care provider. Ages 53 to 70  Blood pressure check.** / Every 1 to 2 years.  Lipid and cholesterol check.** / Every 5 years beginning at age 15.  Lung cancer screening. / Every year if you are aged 65-80 years and have a 30-pack-year history of smoking and currently smoke or have quit within the past 15 years. Yearly screening is stopped once you have quit smoking for at least 15 years or develop a health problem that would prevent you from having lung cancer treatment.  Fecal occult blood test (FOBT) of stool. / Every year beginning at age  59 and continuing until age 92. You may not have to do this test if you get a colonoscopy every 10 years.  Flexible sigmoidoscopy** or  colonoscopy.** / Every 5 years for a flexible sigmoidoscopy or every 10 years for a colonoscopy beginning at age 48 and continuing until age 42.  Hepatitis C blood test.** / For all people born from [birth year] through 1965 and any individual with known risks for hepatitis C.  Skin self-exam. / Monthly.  Influenza vaccine. / Every year.  Tetanus, diphtheria, and acellular pertussis (Tdap/Td) vaccine.** / Consult your health care provider. 1 dose of Td every 10 years.  Varicella vaccine.** / Consult your health care provider.  Zoster vaccine.** / 1 dose for adults aged 74 years or older.  Measles, mumps, rubella (MMR) vaccine.** / You need at least 1 dose of MMR if you were born in 1957 or later. You may also need a second dose.  Pneumococcal 13-valent conjugate (PCV13) vaccine.** / Consult your health care provider.  Pneumococcal polysaccharide (PPSV23) vaccine.** / 1 to 2 doses if you smoke cigarettes or if you have certain conditions.  Meningococcal vaccine.** / Consult your health care provider.  Hepatitis A vaccine.** / Consult your health care provider.  Hepatitis B vaccine.** / Consult your health care provider.  Haemophilus influenzae type b (Hib) vaccine.** / Consult your health care provider. Ages 51 and over  Blood pressure check.** / Every 1 to 2 years.  Lipid and cholesterol check.**/ Every 5 years beginning at age 27.  Lung cancer screening. / Every year if you are aged 49-80 years and have a 30-pack-year history of smoking and currently smoke or have quit within the past 15 years. Yearly screening is stopped once you have quit smoking for at least 15 years or develop a health problem that would prevent you from having lung cancer treatment.  Fecal occult blood test (FOBT) of stool. / Every year beginning at age 87 and continuing until age  53. You may not have to do this test if you get a colonoscopy every 10 years.  Flexible sigmoidoscopy** or colonoscopy.** / Every 5 years for a flexible sigmoidoscopy or every 10 years for a colonoscopy beginning at age 79 and continuing until age 51.  Hepatitis C blood test.** / For all people born from [birth year] through 1965 and any individual with known risks for hepatitis C.  Abdominal aortic aneurysm (AAA) screening.** / A one-time screening for ages 14 to 61 years who are current or former smokers.  Skin self-exam. / Monthly.  Influenza vaccine. / Every year.  Tetanus, diphtheria, and acellular pertussis (Tdap/Td) vaccine.** / 1 dose of Td every 10 years.  Varicella vaccine.** / Consult your health care provider.  Zoster vaccine.** / 1 dose for adults aged 37 years or older.  Pneumococcal 13-valent conjugate (PCV13) vaccine.** / Consult your health care provider.  Pneumococcal polysaccharide (PPSV23) vaccine.** / 1 dose for all adults aged 79 years and older.  Meningococcal vaccine.** / Consult your health care provider.  Hepatitis A vaccine.** / Consult your health care provider.  Hepatitis B vaccine.** / Consult your health care provider.  Haemophilus influenzae type b (Hib) vaccine.** / Consult your health care provider. **Family history and personal history of risk and conditions may change your health care provider's recommendations. Document Released: 04/27/2001 Document Revised: 03/06/2013 Document Reviewed: 07/27/2010 [facility] Patient Information 2015 [place], [place]. This information is not intended to replace advice given to you by your health care provider. Make sure you discuss any questions you have with your health care provider.

## 2014-04-03 NOTE — Assessment & Plan Note (Signed)
Screening labs today and tdap. Will also schedule pt for screening colonoscopy.

## 2014-04-03 NOTE — Progress Notes (Signed)
Pre visit review using our clinic review tool, if applicable. No additional management support is needed unless otherwise documented below in the visit note. 

## 2014-04-04 ENCOUNTER — Other Ambulatory Visit: Payer: Self-pay | Admitting: Medical

## 2014-04-04 MED ORDER — SIMVASTATIN 20 MG PO TABS: 20.0000 mg | ORAL_TABLET | Freq: Every day | ORAL | Status: DC

## 2014-04-08 ENCOUNTER — Encounter: Payer: Self-pay | Admitting: Gastroenterology

## 2014-05-07 ENCOUNTER — Telehealth: Payer: Self-pay | Admitting: *Deleted

## 2014-05-07 NOTE — Telephone Encounter (Signed)
John, [facility] you are well. Reviewing this pt's chart for a PV for 2-29 Monday in his PCP note 03-2014 he states difficult intubation 04-17-03 [email or web address] [id number] or asleep fiberoptic intubation. I cannot find this note but wanted to run this by you. Please advise  Thanks for your time  Marijean Niemann

## 2014-05-08 NOTE — Telephone Encounter (Signed)
Needs new GI appt with myself, next available to discuss colon cancer screening in light of his increased risk for sedation

## 2014-05-08 NOTE — Telephone Encounter (Signed)
Lelan Pons,  Thank you for your vigilance.  This pt's hx of difficult intubation disqualifies him for care at [facility].  Best regards,  Jenny Reichmann

## 2014-05-08 NOTE — Telephone Encounter (Signed)
Dr. Ardis Hughs,  This patient will need to be done at the hospital. [facility] for direct or would you prefer an ov with you or an extender first?  Thank you, Olivia Mackie

## 2014-05-08 NOTE — Telephone Encounter (Signed)
Phone call to patient to discuss need for office visit. He understands and ov scheduled. He did want it to be noted that he did have an oral surgery with intubation without incident 05/14/13, records on file for review.

## 2014-05-27 ENCOUNTER — Encounter: Payer: 59 | Admitting: Gastroenterology

## 2014-06-24 ENCOUNTER — Ambulatory Visit (INDEPENDENT_AMBULATORY_CARE_PROVIDER_SITE_OTHER): Payer: 59 | Admitting: Gastroenterology

## 2014-06-24 ENCOUNTER — Encounter: Payer: Self-pay | Admitting: Gastroenterology

## 2014-06-24 VITALS — BP 124/78 | HR 78 | Ht 74.0 in | Wt 239.4 lb

## 2014-06-24 DIAGNOSIS — Z1211 Encounter for screening for malignant neoplasm of colon: Secondary | ICD-10-CM | POA: Diagnosis not present

## 2014-06-24 DIAGNOSIS — T884XXA Failed or difficult intubation, initial encounter: Secondary | ICD-10-CM

## 2014-06-24 MED ORDER — MOVIPREP 100 G PO SOLR: 1.0000 | Freq: Once | ORAL | Status: DC

## 2014-06-24 NOTE — Patient Instructions (Addendum)
We will ask Osvaldo Angst, CRNA to comment on your 05/2013 ET intubation during oral surgery to decide location of procedure of your colonoscopy ([facility] vs. [facility]). You have been scheduled for a colonoscopy. Please follow written instructions given to you at your visit today.  Please pick up your prep supplies at the pharmacy within the next 1-3 days. If you use inhalers (even only as needed), please bring them with you on the day of your procedure. Your physician has requested that you go to www.[email or web address] and enter the access code given to you at your visit today. This web site gives a general overview about your procedure. However, you should still follow specific instructions given to you by our office regarding your preparation for the procedure.

## 2014-06-24 NOTE — Progress Notes (Signed)
HPI: This is a  wvery pleasant 59 year old man  ho was referred to me by Saguier, Meriam Sprague, PA-C  to evaluate  routine risk for colon cancer, previous difficult airway intubation   Cbc 03/2014 was normal.  Had colonoscopy he thinks 10 years ago, was in 2006; he recalls that there were no polyps, no cancers.  He did have diverticulitis around that   He had renal cancer, surgery for it with a difficult intubation airway.  This was 2006 or so.  They 'needed a fiberoptic'   He's adopted.   No other colonoscopies.  I reviewed his anesthesia note from March 2015 and I see no note that it was a particularly difficult airway intubation   Review of systems: Pertinent positive and negative review of systems were noted in the above HPI section. Complete review of systems was performed and was otherwise normal.    Past Medical History  Diagnosis Date  . Hypertension   . Gout   . History of kidney cancer 2006  . Kidney stones     right  . GERD (gastroesophageal reflux disease)   . Diverticulosis   . Dry skin   . Difficult intubation     on 04/17/03 [facility] MDA rec: awake or asleep fiberoptic intubation  . Arthritis     Past Surgical History  Procedure Laterality Date  . Partial nephrectomy Left 2006  . Surgery scrotal / testicular    . Ear cyst excision N/A 05/14/2013    Procedure: 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;  Surgeon: Isac Caddy, DDS;  Location: [place];  Service: Oral Surgery;  Laterality: N/A;  . Tooth extraction N/A 05/14/2013    Procedure: REMOVAL OF SUPERNUMERARY TOOTH NUMBER FIFTY-NINE WITH BONE GRAFTING;  Surgeon: Isac Caddy, DDS;  Location: [place];  Service: Oral Surgery;  Laterality: N/A;  . Excision of oral tumor      Current Outpatient Prescriptions  Medication Sig Dispense Refill  . allopurinol (ZYLOPRIM) 300 MG tablet Take 300 mg by mouth daily.    . hydrochlorothiazide (HYDRODIURIL) 25 MG tablet Take 25  mg by mouth daily.    . ranitidine (ZANTAC) 150 MG capsule Take 150 mg by mouth daily as needed for heartburn.    . simvastatin (ZOCOR) 20 MG tablet Take 1 tablet (20 mg total) by mouth at bedtime. 30 tablet 5   No current facility-administered medications for this visit.    Allergies as of 06/24/2014  . (No Known Allergies)    Family History  Problem Relation Age of Onset  . Adopted: Yes    History   Social History  . Marital Status: Married    Spouse Name: N/A  . Number of Children: N/A  . Years of Education: N/A   Occupational History  . Not on file.   Social History Main Topics  . Smoking status: Former Smoker -- 4 years    Types: Cigarettes    Quit date: 04/07/1976  . Smokeless tobacco: Not on file  . Alcohol Use: 0.0 oz/week    0 Standard drinks or equivalent per week     Comment: rare  . Drug Use: No  . Sexual Activity: Not on file   Other Topics Concern  . Not on file   Social History Narrative       Physical Exam: BP 124/78 mmHg  Pulse 78  Ht 6\' 2"  (1.88 m)  Wt 239 lb 6.4 oz (108.591 kg)  BMI 30.72 kg/m2  Constitutional: generally well-appearing Psychiatric: alert and oriented x3 Eyes: extraocular movements intact Mouth: oral pharynx moist, no lesions Neck: supple no lymphadenopathy Cardiovascular: heart regular rate and rhythm Lungs: clear to auscultation bilaterally Abdomen: soft, nontender, nondistended, no obvious ascites, no peritoneal signs, normal bowel sounds Extremities: no lower extremity edema bilaterally Skin: no lesions on visible extremities    Assessment and plan: 59 y.o. male with  routine risk for colon cancer, previous difficult airway intubation  [facility]. More recently March 2015 in and the [place] system he had general anesthesia and I see no notation that was a particularly difficult airway intubation. I will have Osvaldo Angst, CRNA review this case to see if he  feels that the patient is appropriate for a meal for surgical center or whether he should be done at the hospital given his previous airway issue.   Cc: Saguier, Meriam Sprague, PA-C

## 2014-06-26 ENCOUNTER — Other Ambulatory Visit: Payer: Self-pay | Admitting: *Deleted

## 2014-06-26 ENCOUNTER — Telehealth: Payer: Self-pay | Admitting: *Deleted

## 2014-06-26 DIAGNOSIS — Z9189 Other specified personal risk factors, not elsewhere classified: Secondary | ICD-10-CM

## 2014-06-26 DIAGNOSIS — Z1211 Encounter for screening for malignant neoplasm of colon: Secondary | ICD-10-CM

## 2014-06-26 NOTE — Telephone Encounter (Signed)
-----   Message from Milus Banister, MD sent at 06/26/2014  7:16 AM EDT ----- Regarding: RE: Difficult intubation Dotty, Can you call him. Let him know Jenny Reichmann reviewed the case and feels it best to do in the hosp.  Can be on 4/21 or 4/28 (thursdays at [facility]) with MAC.  Thanks  Jenny Reichmann, Thanks for reviewing.  DJ  ----- Message -----    From: Osvaldo Angst, CRNA    Sent: 06/26/2014   6:43 AM      To: Milus Banister, MD, Larina Bras, CMA Subject: Difficult intubation                           Malena Edman I remember Gwenith Daily.  Unfortunately the anesthesia note from 05/14/2013 states in  future anesthesia recommendations:Recommend- induction with short-acting agent, and alternative techniques readily available.  We do not have the equipment or amount of staffing to utilize "alternative techniques".  So this nice fellow must have his procedure in the hospital setting.  Thanks,  John  ----- Message -----    From: Larina Bras, CMA    Sent: 06/24/2014   2:55 PM      To: Osvaldo Angst, CRNA  This patient came to see Dr Ardis Hughs today to discuss colonoscopy after previous hx of difficult intubation around 10 years ago. Has since been sedated (see 05/2013 op note in EPIC) with no problems. Dr Ardis Hughs asked that you review this case to make sure he is an appropriate candidate for [place] colonoscopy (currently scheduled for [facility] 07/09/14). By the way.... 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 :)

## 2014-06-26 NOTE — Telephone Encounter (Signed)
Patient advised of John Nulty/Dr Ardis Hughs recommendations. He verbalizes understanding. Patient has been rescheduled to [facility] on 07/04/14 @ 9:30 am. I have sent updated prep instructions to him and he verbalizes understanding.

## 2014-06-28 ENCOUNTER — Encounter (HOSPITAL_COMMUNITY): Payer: Self-pay | Admitting: *Deleted

## 2014-07-03 NOTE — Anesthesia Preprocedure Evaluation (Addendum)
Anesthesia Evaluation  Patient identified by MRN, date of birth, ID band Patient awake    Reviewed: Allergy & Precautions, NPO status , Patient's Chart, lab work & pertinent test results  History of Anesthesia Complications (+) DIFFICULT AIRWAY and history of anesthetic complications (hx of needing a fiberoptic intubation in 2006, no records available for this, intubated last year at [facility] with grade 1 view with glidescope, will have this available and plan for MAC, limiting respriatory depressants)  Airway Mallampati: II  TM Distance: >3 FB Neck ROM: Full    Dental no notable dental hx. (+) Dental Advisory Given   Pulmonary former smoker,  breath sounds clear to auscultation  Pulmonary exam normal       Cardiovascular hypertension, Pt. on medications Rhythm:Regular Rate:Normal     Neuro/Psych negative neurological ROS  negative psych ROS   GI/Hepatic Neg liver ROS, GERD-  Medicated and Controlled,  Endo/Other  negative endocrine ROS  Renal/GU negative Renal ROS  negative genitourinary   Musculoskeletal negative musculoskeletal ROS (+)   Abdominal   Peds negative pediatric ROS (+)  Hematology negative hematology ROS (+)   Anesthesia Other Findings   Reproductive/Obstetrics negative OB ROS                            Anesthesia Physical Anesthesia Plan  ASA: II  Anesthesia Plan: MAC   Post-op Pain Management:    Induction: Intravenous  Airway Management Planned: Nasal Cannula  Additional Equipment:   Intra-op Plan:   Post-operative Plan:   Informed Consent: I have reviewed the patients History and Physical, chart, labs and discussed the procedure including the risks, benefits and alternatives for the proposed anesthesia with the patient or authorized representative who has indicated his/her understanding and acceptance.   Dental advisory given  Plan Discussed with:  CRNA  Anesthesia Plan Comments:         Anesthesia Quick Evaluation

## 2014-07-04 ENCOUNTER — Encounter (HOSPITAL_COMMUNITY)
Admission: RE | Disposition: A | Discharge status: Home / Self Care | Payer: Self-pay | Source: Ambulatory Visit | Attending: Gastroenterology

## 2014-07-04 ENCOUNTER — Encounter (HOSPITAL_COMMUNITY): Payer: Self-pay | Admitting: Gastroenterology

## 2014-07-04 ENCOUNTER — Ambulatory Visit (HOSPITAL_COMMUNITY): Payer: 59 | Admitting: Anesthesiology

## 2014-07-04 ENCOUNTER — Ambulatory Visit (HOSPITAL_COMMUNITY)
Admission: RE | Admit: 2014-07-04 | Discharge: 2014-07-04 | Disposition: A | Discharge status: Home / Self Care | Payer: 59 | Source: Ambulatory Visit | Attending: Gastroenterology | Admitting: Gastroenterology

## 2014-07-04 DIAGNOSIS — Z1211 Encounter for screening for malignant neoplasm of colon: Secondary | ICD-10-CM | POA: Diagnosis not present

## 2014-07-04 DIAGNOSIS — Z79899 Other long term (current) drug therapy: Secondary | ICD-10-CM | POA: Insufficient documentation

## 2014-07-04 DIAGNOSIS — M1990 Unspecified osteoarthritis, unspecified site: Secondary | ICD-10-CM | POA: Insufficient documentation

## 2014-07-04 DIAGNOSIS — D122 Benign neoplasm of ascending colon: Secondary | ICD-10-CM | POA: Diagnosis not present

## 2014-07-04 DIAGNOSIS — K219 Gastro-esophageal reflux disease without esophagitis: Secondary | ICD-10-CM | POA: Insufficient documentation

## 2014-07-04 DIAGNOSIS — K621 Rectal polyp: Secondary | ICD-10-CM | POA: Diagnosis not present

## 2014-07-04 DIAGNOSIS — Z905 Acquired absence of kidney: Secondary | ICD-10-CM | POA: Diagnosis not present

## 2014-07-04 DIAGNOSIS — Z9189 Other specified personal risk factors, not elsewhere classified: Secondary | ICD-10-CM

## 2014-07-04 DIAGNOSIS — D125 Benign neoplasm of sigmoid colon: Secondary | ICD-10-CM | POA: Diagnosis not present

## 2014-07-04 DIAGNOSIS — M109 Gout, unspecified: Secondary | ICD-10-CM | POA: Insufficient documentation

## 2014-07-04 DIAGNOSIS — K5730 Diverticulosis of large intestine without perforation or abscess without bleeding: Secondary | ICD-10-CM | POA: Insufficient documentation

## 2014-07-04 DIAGNOSIS — Z85528 Personal history of other malignant neoplasm of kidney: Secondary | ICD-10-CM | POA: Insufficient documentation

## 2014-07-04 DIAGNOSIS — Z87891 Personal history of nicotine dependence: Secondary | ICD-10-CM | POA: Diagnosis not present

## 2014-07-04 DIAGNOSIS — D128 Benign neoplasm of rectum: Secondary | ICD-10-CM | POA: Diagnosis not present

## 2014-07-04 DIAGNOSIS — I10 Essential (primary) hypertension: Secondary | ICD-10-CM | POA: Diagnosis not present

## 2014-07-04 HISTORY — PX: COLONOSCOPY WITH PROPOFOL: SHX5780

## 2014-07-04 SURGERY — COLONOSCOPY WITH PROPOFOL
Anesthesia: Monitor Anesthesia Care

## 2014-07-04 MED ORDER — LACTATED RINGERS IV SOLN
INTRAVENOUS | Status: DC
  Administered 2014-07-04: 1000 mL via INTRAVENOUS

## 2014-07-04 MED ORDER — PROPOFOL INFUSION 10 MG/ML OPTIME
INTRAVENOUS | Status: DC | PRN
  Administered 2014-07-04: 200 ug/kg/min via INTRAVENOUS

## 2014-07-04 MED ORDER — PROPOFOL 10 MG/ML IV BOLUS
INTRAVENOUS | Status: AC
  Filled 2014-07-04: qty 20

## 2014-07-04 MED ORDER — KETAMINE HCL 10 MG/ML IJ SOLN
INTRAMUSCULAR | Status: AC
  Filled 2014-07-04: qty 1

## 2014-07-04 MED ORDER — MIDAZOLAM HCL 5 MG/5ML IJ SOLN
INTRAMUSCULAR | Status: DC | PRN
  Administered 2014-07-04: 2 mg via INTRAVENOUS

## 2014-07-04 MED ORDER — LIDOCAINE HCL 1 % IJ SOLN
INTRAMUSCULAR | Status: DC | PRN
  Administered 2014-07-04: 50 mg via INTRADERMAL

## 2014-07-04 MED ORDER — MIDAZOLAM HCL 2 MG/2ML IJ SOLN
INTRAMUSCULAR | Status: AC
  Filled 2014-07-04: qty 2

## 2014-07-04 MED ORDER — SODIUM CHLORIDE 0.9 % IV SOLN: INTRAVENOUS | Status: DC

## 2014-07-04 MED ORDER — LIDOCAINE HCL (CARDIAC) 20 MG/ML IV SOLN
INTRAVENOUS | Status: AC
  Filled 2014-07-04: qty 5

## 2014-07-04 MED ORDER — KETAMINE HCL 10 MG/ML IJ SOLN
INTRAMUSCULAR | Status: DC | PRN
  Administered 2014-07-04 (×2): 10 mg via INTRAVENOUS

## 2014-07-04 MED ORDER — FENTANYL CITRATE (PF) 100 MCG/2ML IJ SOLN
INTRAMUSCULAR | Status: AC
  Filled 2014-07-04: qty 2

## 2014-07-04 SURGICAL SUPPLY — 22 items

## 2014-07-04 NOTE — H&P (View-Only) (Signed)
HPI: This is a  wvery pleasant 59 year old man  ho was referred to me by Saguier, Brian Sprague, PA-C  to evaluate  routine risk for colon cancer, previous difficult airway intubation   Cbc 03/2014 was normal.  Had colonoscopy he thinks 10 years ago, was in 2006; he recalls that there were no polyps, no cancers.  He did have diverticulitis around that   He had renal cancer, surgery for it with a difficult intubation airway.  This was 2006 or so.  They 'needed a fiberoptic'   He's adopted.   No other colonoscopies.  I reviewed his anesthesia note from March 2015 and I see no note that it was a particularly difficult airway intubation   Review of systems: Pertinent positive and negative review of systems were noted in the above HPI section. Complete review of systems was performed and was otherwise normal.    Past Medical History  Diagnosis Date  . Hypertension   . Gout   . History of kidney cancer 2006  . Kidney stones     right  . GERD (gastroesophageal reflux disease)   . Diverticulosis   . Dry skin   . Difficult intubation     on 04/17/03 [facility] MDA rec: awake or asleep fiberoptic intubation  . Arthritis     Past Surgical History  Procedure Laterality Date  . Partial nephrectomy Left 2006  . Surgery scrotal / testicular    . Ear cyst excision N/A 05/14/2013    Procedure: 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;  Surgeon: Brian Sawyer, DDS;  Location: [place];  Service: Oral Surgery;  Laterality: N/A;  . Tooth extraction N/A 05/14/2013    Procedure: REMOVAL OF SUPERNUMERARY TOOTH NUMBER FIFTY-NINE WITH BONE GRAFTING;  Surgeon: Brian Sawyer, DDS;  Location: [place];  Service: Oral Surgery;  Laterality: N/A;  . Excision of oral tumor      Current Outpatient Prescriptions  Medication Sig Dispense Refill  . allopurinol (ZYLOPRIM) 300 MG tablet Take 300 mg by mouth daily.    . hydrochlorothiazide (HYDRODIURIL) 25 MG tablet Take 25  mg by mouth daily.    . ranitidine (ZANTAC) 150 MG capsule Take 150 mg by mouth daily as needed for heartburn.    . simvastatin (ZOCOR) 20 MG tablet Take 1 tablet (20 mg total) by mouth at bedtime. 30 tablet 5   No current facility-administered medications for this visit.    Allergies as of 06/24/2014  . (No Known Allergies)    Family History  Problem Relation Age of Onset  . Adopted: Yes    History   Social History  . Marital Status: Married    Spouse Name: N/A  . Number of Children: N/A  . Years of Education: N/A   Occupational History  . Not on file.   Social History Main Topics  . Smoking status: Former Smoker -- 4 years    Types: Cigarettes    Quit date: 04/07/1976  . Smokeless tobacco: Not on file  . Alcohol Use: 0.0 oz/week    0 Standard drinks or equivalent per week     Comment: rare  . Drug Use: No  . Sexual Activity: Not on file   Other Topics Concern  . Not on file   Social History Narrative       Physical Exam: BP 124/78 mmHg  Pulse 78  Ht 6\' 2"  (1.88 m)  Wt 239 lb 6.4 oz (108.591 kg)  BMI 30.72 kg/m2  Constitutional: generally well-appearing Psychiatric: alert and oriented x3 Eyes: extraocular movements intact Mouth: oral pharynx moist, no lesions Neck: supple no lymphadenopathy Cardiovascular: heart regular rate and rhythm Lungs: clear to auscultation bilaterally Abdomen: soft, nontender, nondistended, no obvious ascites, no peritoneal signs, normal bowel sounds Extremities: no lower extremity edema bilaterally Skin: no lesions on visible extremities    Assessment and plan: 59 y.o. male with  routine risk for colon cancer, previous difficult airway intubation  [facility]. More recently March 2015 in and the [place] system he had general anesthesia and I see no notation that was a particularly difficult airway intubation. I will have Brian Angst, CRNA review this case to see if he  feels that the patient is appropriate for a meal for surgical center or whether he should be done at the hospital given his previous airway issue.   Cc: Saguier, Brian Sprague, PA-C

## 2014-07-04 NOTE — Transfer of Care (Signed)
Immediate Anesthesia Transfer of Care Note  Patient: Brian Sawyer  Procedure(s) Performed: Procedure(s): COLONOSCOPY WITH PROPOFOL (N/A)  Patient Location: PACU and Endoscopy Unit  Anesthesia Type:MAC  Level of Consciousness: awake, oriented and patient cooperative  Airway & Oxygen Therapy: Patient Spontanous Breathing and Patient connected to face mask oxygen  Post-op Assessment: Report given to RN and Post -op Vital signs reviewed and stable  Post vital signs: Reviewed and stable  Last Vitals:  Filed Vitals:   07/04/14 0829  BP: 168/95  Pulse: 94  Temp: 36.5 C  Resp: 18    Complications: No apparent anesthesia complications

## 2014-07-04 NOTE — Op Note (Signed)
[facility] [place], [address]   COLONOSCOPY PROCEDURE REPORT  PATIENT: Brian Sawyer, Brian Sawyer  MR#: [id number] BIRTHDATE: 02-21-1956 , 27  yrs. old GENDER: male ENDOSCOPIST: Milus Banister, MD REFERRED BY: [name], MD PROCEDURE DATE:  07/04/2014 PROCEDURE:   Colonoscopy, screening and Colonoscopy with snare polypectomy First Screening Colonoscopy - Avg.  risk and is 50 yrs.  old or older - No.  Prior Negative Screening - Now for repeat screening. 10 or more years since last screening  History of Adenoma - Now for follow-up colonoscopy & has been > or = to 3 yrs.  N/A ASA CLASS:   Class II INDICATIONS:average risk patient for colon cancer. MEDICATIONS: Monitored anesthesia care  DESCRIPTION OF PROCEDURE:   After the risks benefits and alternatives of the procedure were thoroughly explained, informed consent was obtained.  The digital rectal exam revealed no abnormalities of the rectum.   The EC-[id number] ([id number])  endoscope was introduced through the anus and advanced to the cecum, which was identified by both the appendix and ileocecal valve. No adverse events experienced.   The quality of the prep was good.  The instrument was then slowly withdrawn as the colon was fully examined.   COLON FINDINGS: Four polyps were found, removed and sent to pathology.  These were all sessile, ranged in size from 14mm to 91mm, located in ascending, sigmoid and rectum segments, all were removed with cold snare.  There was diverticulosis throughout the colon. The examination was otherwise normal.  Retroflexed views revealed no abnormalities. The time to cecum = 5      Withdrawal time = 10        The scope was withdrawn and the procedure completed. COMPLICATIONS: There were no immediate complications.  ENDOSCOPIC IMPRESSION: Four polyps were found, removed and sent to pathology. There was diverticulosis throughout the colon. The examination was otherwise  normal  RECOMMENDATIONS: If the polyp(s) removed today are proven to be adenomatous (pre-cancerous) polyps, you will need a colonoscopy in 3-5 years. Otherwise you should continue to follow colorectal cancer screening guidelines for "routine risk" patients with a colonoscopy in 10 years.  You will receive a letter within 1-2 weeks with the results of your biopsy as well as final recommendations.  Please call my office if you have not received a letter after 3 weeks.  eSigned:  Milus Banister, MD 07/04/2014 9:31 AM

## 2014-07-04 NOTE — Interval H&P Note (Signed)
History and Physical Interval Note:  07/04/2014 8:00 AM  Brian Sawyer  has presented today for surgery, with the diagnosis of screening colon  The various methods of treatment have been discussed with the patient and family. After consideration of risks, benefits and other options for treatment, the patient has consented to  Procedure(s): COLONOSCOPY WITH PROPOFOL (N/A) as a surgical intervention .  The patient's history has been reviewed, patient examined, no change in status, stable for surgery.  I have reviewed the patient's chart and labs.  Questions were answered to the patient's satisfaction.     Milus Banister

## 2014-07-04 NOTE — Anesthesia Postprocedure Evaluation (Signed)
  Anesthesia Post-op Note  Patient: Brian Sawyer  Procedure(s) Performed: Procedure(s) (LRB): COLONOSCOPY WITH PROPOFOL (N/A)  Patient Location: PACU  Anesthesia Type: MAC  Level of Consciousness: awake and alert   Airway and Oxygen Therapy: Patient Spontanous Breathing  Post-op Pain: mild  Post-op Assessment: Post-op Vital signs reviewed, Patient's Cardiovascular Status Stable, Respiratory Function Stable, Patent Airway and No signs of Nausea or vomiting  Last Vitals:  Filed Vitals:   07/04/14 0955  BP:   Pulse: 85  Temp:   Resp: 20    Post-op Vital Signs: stable   Complications: No apparent anesthesia complications

## 2014-07-04 NOTE — Discharge Instructions (Signed)

## 2014-07-05 ENCOUNTER — Encounter (HOSPITAL_COMMUNITY): Payer: Self-pay | Admitting: Gastroenterology

## 2014-07-09 ENCOUNTER — Encounter: Payer: 59 | Admitting: Gastroenterology

## 2015-01-06 ENCOUNTER — Emergency Department (HOSPITAL_BASED_OUTPATIENT_CLINIC_OR_DEPARTMENT_OTHER)
Admission: EM | Admit: 2015-01-06 | Discharge: 2015-01-06 | Disposition: A | Discharge status: Home / Self Care | Payer: 59 | Attending: Emergency Medicine | Admitting: Emergency Medicine

## 2015-01-06 ENCOUNTER — Encounter (HOSPITAL_BASED_OUTPATIENT_CLINIC_OR_DEPARTMENT_OTHER): Payer: Self-pay | Admitting: *Deleted

## 2015-01-06 DIAGNOSIS — I10 Essential (primary) hypertension: Secondary | ICD-10-CM | POA: Diagnosis not present

## 2015-01-06 DIAGNOSIS — Z87442 Personal history of urinary calculi: Secondary | ICD-10-CM | POA: Insufficient documentation

## 2015-01-06 DIAGNOSIS — M109 Gout, unspecified: Secondary | ICD-10-CM | POA: Insufficient documentation

## 2015-01-06 DIAGNOSIS — Z79899 Other long term (current) drug therapy: Secondary | ICD-10-CM | POA: Insufficient documentation

## 2015-01-06 DIAGNOSIS — R51 Headache: Secondary | ICD-10-CM | POA: Diagnosis present

## 2015-01-06 DIAGNOSIS — H6123 Impacted cerumen, bilateral: Secondary | ICD-10-CM | POA: Diagnosis not present

## 2015-01-06 DIAGNOSIS — Z87891 Personal history of nicotine dependence: Secondary | ICD-10-CM | POA: Insufficient documentation

## 2015-01-06 DIAGNOSIS — K219 Gastro-esophageal reflux disease without esophagitis: Secondary | ICD-10-CM | POA: Insufficient documentation

## 2015-01-06 DIAGNOSIS — Z85528 Personal history of other malignant neoplasm of kidney: Secondary | ICD-10-CM | POA: Insufficient documentation

## 2015-01-06 DIAGNOSIS — J0111 Acute recurrent frontal sinusitis: Secondary | ICD-10-CM | POA: Insufficient documentation

## 2015-01-06 MED ORDER — DEXAMETHASONE SODIUM PHOSPHATE 10 MG/ML IJ SOLN: 10.0000 mg | Freq: Once | INTRAMUSCULAR | Status: DC

## 2015-01-06 MED ORDER — AMOXICILLIN-POT CLAVULANATE 875-125 MG PO TABS: 1.0000 | ORAL_TABLET | Freq: Two times a day (BID) | ORAL | Status: DC

## 2015-01-06 MED ORDER — AMOXICILLIN-POT CLAVULANATE 875-125 MG PO TABS
1.0000 | ORAL_TABLET | Freq: Once | ORAL | Status: AC
  Administered 2015-01-06: 1 via ORAL
  Filled 2015-01-06: qty 1

## 2015-01-06 MED ORDER — FLUTICASONE PROPIONATE 50 MCG/ACT NA SUSP
2.0000 | Freq: Every day | NASAL | Status: DC
Start: 2015-01-06 — End: 2016-06-10

## 2015-01-06 MED ORDER — KETOROLAC TROMETHAMINE 60 MG/2ML IM SOLN
30.0000 mg | Freq: Once | INTRAMUSCULAR | Status: AC
  Administered 2015-01-06: 30 mg via INTRAMUSCULAR
  Filled 2015-01-06: qty 2

## 2015-01-06 MED ORDER — DEXAMETHASONE SODIUM PHOSPHATE 10 MG/ML IJ SOLN
10.0000 mg | Freq: Once | INTRAMUSCULAR | Status: AC
  Administered 2015-01-06: 10 mg via INTRAMUSCULAR
  Filled 2015-01-06: qty 1

## 2015-01-06 NOTE — ED Provider Notes (Signed)
CSN: [id number]     Arrival date & time 01/06/15  1741 History   First MD Initiated Contact with Patient 01/06/15 2030     Chief Complaint  Patient presents with  . Facial Pain     (Consider location/radiation/quality/duration/timing/severity/associated sxs/prior Treatment) HPI Brian Sawyer is a 59 y.o. male with history of hypertension, presents to emergency department complaining of sinus headache. Patient states that he has had flu like symptoms for the last week. He reports nasal congestion, cough, sore throat. States yesterday he noticed that his symptoms started to improve but he developed severe left facial pain and pressure. He states that his nasal passages are clear but sometimes he will blow out green thick malodorous mucus. He also reports low-grade fever at home. He reports history of sinus infections states this feels the same. He states he has tried over-the-counter Tylenol, Motrin, Sudafed with no relief. States he used saline flushes.  No visual changes. No eye pain. No injuries. Denies any neck pain or stiffness.  Past Medical History  Diagnosis Date  . Hypertension   . Gout   . History of kidney cancer 2006    left   . Kidney stones     right  . GERD (gastroesophageal reflux disease)   . Diverticulosis   . Difficult intubation     on 04/17/03 [facility] MDA rec: awake or asleep fiberoptic intubation, kidney surgery   Past Surgical History  Procedure Laterality Date  . Partial nephrectomy Left 2006  . Surgery scrotal / testicular  1980s  . Ear cyst excision N/A 05/14/2013    Procedure: 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;  Surgeon: Isac Caddy, DDS;  Location: [place];  Service: Oral Surgery;  Laterality: N/A;  . Tooth extraction N/A 05/14/2013    Procedure: REMOVAL OF SUPERNUMERARY TOOTH NUMBER FIFTY-NINE WITH BONE GRAFTING;  Surgeon: Isac Caddy, DDS;  Location: [place];  Service: Oral Surgery;  Laterality: N/A;   . Excision of oral tumor    . Colonoscopy with propofol N/A 07/04/2014    Procedure: COLONOSCOPY WITH PROPOFOL;  Surgeon: Milus Banister, MD;  Location: [facility];  Service: Endoscopy;  Laterality: N/A;   Family History  Problem Relation Age of Onset  . Adopted: Yes   Social History  Substance Use Topics  . Smoking status: Former Smoker -- 4 years    Types: Cigarettes    Quit date: 04/07/1976  . Smokeless tobacco: Never Used  . Alcohol Use: 0.0 oz/week    0 Standard drinks or equivalent per week     Comment: rare    Review of Systems  Constitutional: Negative for fever and chills.  HENT: Positive for congestion. Negative for sore throat.   Respiratory: Negative for cough, chest tightness and shortness of breath.   Cardiovascular: Negative for chest pain, palpitations and leg swelling.  Gastrointestinal: Negative for vomiting.  Musculoskeletal: Negative for myalgias, arthralgias, neck pain and neck stiffness.  Skin: Negative for rash.  Allergic/Immunologic: Negative for immunocompromised state.  Neurological: Positive for headaches. Negative for dizziness, weakness, light-headedness and numbness.      Allergies  Review of patient's allergies indicates no known allergies.  Home Medications   Prior to Admission medications   Medication Sig Start Date End Date Taking? Authorizing Provider  allopurinol (ZYLOPRIM) 300 MG tablet Take 300 mg by mouth daily.    Historical Provider, MD  hydrochlorothiazide (HYDRODIURIL) 25 MG tablet Take 25 mg by mouth every morning.  Historical Provider, MD  MOVIPREP 100 G SOLR Take 1 kit (200 g total) by mouth once. 06/24/14   Milus Banister, MD  ranitidine (ZANTAC) 150 MG capsule Take 150 mg by mouth daily as needed for heartburn.    Historical Provider, MD  simvastatin (ZOCOR) 20 MG tablet Take 1 tablet (20 mg total) by mouth at bedtime. 04/04/14   Edward Saguier, PA-C   BP 166/91 mmHg  Pulse 93  Temp(Src) 99.9 F (37.7 C) (Oral)   Resp 18  Ht _0  (1.905 m)  Wt 230 lb (104.327 kg)  BMI 28.75 kg/m2  SpO2 97% Physical Exam  Constitutional: He appears well-developed and well-nourished. No distress.  HENT:  Head: Normocephalic and atraumatic.  Right Ear: External ear normal.  Left Ear: External ear normal.  Nose: Mucosal edema present. Right sinus exhibits no maxillary sinus tenderness and no frontal sinus tenderness. Left sinus exhibits frontal sinus tenderness. Left sinus exhibits no maxillary sinus tenderness.  Mouth/Throat: Uvula is midline, oropharynx is clear and moist and mucous membranes are normal.  Cerumen impaction bilateral  Eyes: Conjunctivae are normal.  Neck: Neck supple.  Cardiovascular: Normal rate, regular rhythm and normal heart sounds.   Pulmonary/Chest: Effort normal. No respiratory distress. He has no wheezes. He has no rales.  Musculoskeletal: He exhibits no edema.  Neurological: He is alert.  Skin: Skin is warm and dry.  Nursing note and vitals reviewed.   ED Course  Procedures (including critical care time) Labs Review Labs Reviewed - No data to display  Imaging Review No results found. I have personally reviewed and evaluated these images and lab results as part of my medical decision-making.   EKG Interpretation None      MDM   Final diagnoses:  Acute recurrent frontal sinusitis   Patient with sinus pain and tenderness on exam. He does have a low-grade temperature of 99.9. He reports thick, green, I'll order is nasal discharge. Patient's symptoms for over a week and now worsening. Most likely sinus infection. Will start on Augmentin, continue intranasal saline, start Flonase, continue decongestants. Patient's vital signs otherwise unremarkable except for mildly elevated blood pressure. Instructed to follow-up with her primary care doctor for recheck of blood pressure and sinus infection. Return precautions discussed.  Filed Vitals:   01/06/15 2022 01/06/15 2053 01/06/15  2120 01/06/15 2125  BP: 166/91   164/97  Pulse: 82 93  90  Temp: 97.6 F (36.4 C) 99.9 F (37.7 C) 99.1 F (37.3 C) 99.1 F (37.3 C)  TempSrc: Oral Oral  Oral  Resp: 18   16  Height:      Weight:      SpO2: 96% 97%  97%      Jeannett Senior, PA-C 01/07/15 0120  Harvel Quale, MD 01/07/15 0221

## 2015-01-06 NOTE — ED Notes (Signed)
PA at bedside.

## 2015-01-06 NOTE — ED Notes (Signed)
Nurse first rounds: pt updated on wait time-c/o headache and sinusitis.

## 2015-01-06 NOTE — ED Notes (Signed)
Facial pain x 2 days. Hx of sinusitis and states this pain feels the same.

## 2015-01-06 NOTE — Discharge Instructions (Signed)
Continue tylenol or motrin for pain. Continue sudafed. Take augmentin as prescribed until all gone for sinus infection. flonase daily. Continue saline washes. Follow up with your primary care doctor.    Sinusitis, Adult Sinusitis is redness, soreness, and inflammation of the paranasal sinuses. Paranasal sinuses are air pockets within the bones of your face. They are located beneath your eyes, in the middle of your forehead, and above your eyes. In healthy paranasal sinuses, mucus is able to drain out, and air is able to circulate through them by way of your nose. However, when your paranasal sinuses are inflamed, mucus and air can become trapped. This can allow bacteria and other germs to grow and cause infection. Sinusitis can develop quickly and last only a short time (acute) or continue over a long period (chronic). Sinusitis that lasts for more than 12 weeks is considered chronic. CAUSES Causes of sinusitis include:  Allergies.  Structural abnormalities, such as displacement of the cartilage that separates your nostrils (deviated septum), which can decrease the air flow through your nose and sinuses and affect sinus drainage.  Functional abnormalities, such as when the small hairs (cilia) that line your sinuses and help remove mucus do not work properly or are not present. SIGNS AND SYMPTOMS Symptoms of acute and chronic sinusitis are the same. The primary symptoms are pain and pressure around the affected sinuses. Other symptoms include:  Upper toothache.  Earache.  Headache.  Bad breath.  Decreased sense of smell and taste.  A cough, which worsens when you are lying flat.  Fatigue.  Fever.  Thick drainage from your nose, which often is green and may contain pus (purulent).  Swelling and warmth over the affected sinuses. DIAGNOSIS Your health care provider will perform a physical exam. During your exam, your health care provider may perform any of the following to help  determine if you have acute sinusitis or chronic sinusitis:  Look in your nose for signs of abnormal growths in your nostrils (nasal polyps).  Tap over the affected sinus to check for signs of infection.  View the inside of your sinuses using an imaging device that has a light attached (endoscope). If your health care provider suspects that you have chronic sinusitis, one or more of the following tests may be recommended:  Allergy tests.  Nasal culture. A sample of mucus is taken from your nose, sent to a lab, and screened for bacteria.  Nasal cytology. A sample of mucus is taken from your nose and examined by your health care provider to determine if your sinusitis is related to an allergy. TREATMENT Most cases of acute sinusitis are related to a viral infection and will resolve on their own within 10 days. Sometimes, medicines are prescribed to help relieve symptoms of both acute and chronic sinusitis. These may include pain medicines, decongestants, nasal steroid sprays, or saline sprays. However, for sinusitis related to a bacterial infection, your health care provider will prescribe antibiotic medicines. These are medicines that will help kill the bacteria causing the infection. Rarely, sinusitis is caused by a fungal infection. In these cases, your health care provider will prescribe antifungal medicine. For some cases of chronic sinusitis, surgery is needed. Generally, these are cases in which sinusitis recurs more than 3 times per year, despite other treatments. HOME CARE INSTRUCTIONS  Drink plenty of water. Water helps thin the mucus so your sinuses can drain more easily.  Use a humidifier.  Inhale steam 3-4 times a day (for example, sit in the  bathroom with the shower running).  Apply a warm, moist washcloth to your face 3-4 times a day, or as directed by your health care provider.  Use saline nasal sprays to help moisten and clean your sinuses.  Take medicines only as  directed by your health care provider.  If you were prescribed either an antibiotic or antifungal medicine, finish it all even if you start to feel better. SEEK IMMEDIATE MEDICAL CARE IF:  You have increasing pain or severe headaches.  You have nausea, vomiting, or drowsiness.  You have swelling around your face.  You have vision problems.  You have a stiff neck.  You have difficulty breathing.   This information is not intended to replace advice given to you by your health care provider. Make sure you discuss any questions you have with your health care provider.   Document Released: 03/01/2005 Document Revised: 03/22/2014 Document Reviewed: 03/16/2011 Elsevier Interactive Patient Education [employer].

## 2015-03-18 MED FILL — ALLOPURINOL 300 MG TABLET: 300 | 30 days supply | Qty: 30 | Fill #3

## 2015-03-18 MED FILL — HYDROCHLOROTHIAZIDE 25 MG T: 25 | 30 days supply | Qty: 30 | Fill #2

## 2015-04-28 MED FILL — HYDROCHLOROTHIAZIDE 25 MG T: 25 | 30 days supply | Qty: 30 | Fill #3

## 2015-04-28 MED FILL — ALLOPURINOL 300 MG TABLET: 300 | 30 days supply | Qty: 30 | Fill #4

## 2015-06-05 MED FILL — HYDROCHLOROTHIAZIDE 25 MG T: 25 | 30 days supply | Qty: 30 | Fill #4

## 2015-06-05 MED FILL — ALLOPURINOL 300 MG TABLET: 300 | 30 days supply | Qty: 30 | Fill #5

## 2015-07-10 MED FILL — HYDROCHLOROTHIAZIDE 25 MG T: 25 | 30 days supply | Qty: 30 | Fill #5

## 2015-07-10 MED FILL — ALLOPURINOL 300 MG TABLET: 300 | 30 days supply | Qty: 30 | Fill #6

## 2015-08-15 MED FILL — ALLOPURINOL 300 MG TABLET: 300 | 30 days supply | Qty: 30 | Fill #7

## 2015-08-15 MED FILL — HYDROCHLOROTHIAZIDE 25 MG T: 25 | 30 days supply | Qty: 30 | Fill #6

## 2015-10-07 MED FILL — ALLOPURINOL 300 MG TABLET: 300 | 30 days supply | Qty: 30 | Fill #0

## 2015-10-07 MED FILL — HYDROCHLOROTHIAZIDE 25 MG T: 25 | 30 days supply | Qty: 30 | Fill #0

## 2015-11-26 MED FILL — ALLOPURINOL 300 MG TABLET: 300 | 30 days supply | Qty: 30 | Fill #1

## 2015-11-26 MED FILL — HYDROCHLOROTHIAZIDE 25 MG T: 25 | 30 days supply | Qty: 30 | Fill #1

## 2016-01-05 ENCOUNTER — Encounter: Payer: Self-pay | Admitting: Family Medicine

## 2016-01-19 MED FILL — HYDROCHLOROTHIAZIDE 25 MG T: 25 | 30 days supply | Qty: 30 | Fill #2

## 2016-01-19 MED FILL — ALLOPURINOL 300 MG TABLET: 300 | 30 days supply | Qty: 30 | Fill #2

## 2016-01-22 MED FILL — DOXYCYCLINE HYCLATE 100 MG: 100 | 5 days supply | Qty: 10 | Fill #0

## 2016-03-04 MED FILL — ALLOPURINOL 300 MG TABLET: 300 | 30 days supply | Qty: 30 | Fill #3

## 2016-03-04 MED FILL — HYDROCHLOROTHIAZIDE 25 MG T: 25 | 30 days supply | Qty: 30 | Fill #3

## 2016-04-19 MED FILL — HYDROCHLOROTHIAZIDE 25 MG T: 25 | 30 days supply | Qty: 30 | Fill #4

## 2016-04-19 MED FILL — ALLOPURINOL 300 MG TABLET: 300 | 30 days supply | Qty: 30 | Fill #4

## 2016-06-03 MED FILL — ALLOPURINOL 300 MG TAB: 300 | 30 days supply | Qty: 30 | Fill #5

## 2016-06-03 MED FILL — HYDROCHLOROTHIAZIDE 25 MG T: 25 | 30 days supply | Qty: 30 | Fill #5

## 2016-06-10 ENCOUNTER — Encounter: Payer: Self-pay | Admitting: Medical

## 2016-06-10 ENCOUNTER — Ambulatory Visit (INDEPENDENT_AMBULATORY_CARE_PROVIDER_SITE_OTHER): Payer: 59 | Admitting: Medical

## 2016-06-10 ENCOUNTER — Telehealth: Payer: Self-pay | Admitting: Medical

## 2016-06-10 VITALS — BP 130/85 | HR 78 | Temp 97.5°F | Resp 16 | Ht 74.0 in | Wt 237.1 lb

## 2016-06-10 DIAGNOSIS — Z0000 Encounter for general adult medical examination without abnormal findings: Secondary | ICD-10-CM

## 2016-06-10 DIAGNOSIS — Z860100 Personal history of colon polyps, unspecified: Secondary | ICD-10-CM

## 2016-06-10 DIAGNOSIS — R0683 Snoring: Secondary | ICD-10-CM

## 2016-06-10 DIAGNOSIS — Z125 Encounter for screening for malignant neoplasm of prostate: Secondary | ICD-10-CM

## 2016-06-10 DIAGNOSIS — Z113 Encounter for screening for infections with a predominantly sexual mode of transmission: Secondary | ICD-10-CM

## 2016-06-10 LAB — CBC WITH DIFFERENTIAL/PLATELET
BASOS ABS: 0.1 10*3/uL (ref 0.0–0.1)
Basophils Relative: 0.9 % (ref 0.0–3.0)
Eosinophils Absolute: 0.3 10*3/uL (ref 0.0–0.7)
Eosinophils Relative: 4.1 % (ref 0.0–5.0)
HCT: 46.6 % (ref 39.0–52.0)
HEMOGLOBIN: 15.6 g/dL (ref 13.0–17.0)
LYMPHS PCT: 32.3 % (ref 12.0–46.0)
Lymphs Abs: 2.4 10*3/uL (ref 0.7–4.0)
MCHC: 33.5 g/dL (ref 30.0–36.0)
MCV: 87.4 fl (ref 78.0–100.0)
MONOS PCT: 6.4 % (ref 3.0–12.0)
Monocytes Absolute: 0.5 10*3/uL (ref 0.1–1.0)
NEUTROS PCT: 56.3 % (ref 43.0–77.0)
Neutro Abs: 4.1 10*3/uL (ref 1.4–7.7)
Platelets: 263 10*3/uL (ref 150.0–400.0)
RBC: 5.33 Mil/uL (ref 4.22–5.81)
RDW: 14.8 % (ref 11.5–15.5)
WBC: 7.3 10*3/uL (ref 4.0–10.5)

## 2016-06-10 LAB — COMPREHENSIVE METABOLIC PANEL
ALT: 26 U/L (ref 0–53)
AST: 37 U/L (ref 0–37)
Albumin: 5.1 g/dL (ref 3.5–5.2)
Alkaline Phosphatase: 100 U/L (ref 39–117)
BUN: 16 mg/dL (ref 6–23)
CO2: 28 mEq/L (ref 19–32)
Calcium: 9.8 mg/dL (ref 8.4–10.5)
Chloride: 100 mEq/L (ref 96–112)
Creatinine, Ser: 1.02 mg/dL (ref 0.40–1.50)
GFR: 79 mL/min (ref >60.00)
GLUCOSE: 119 mg/dL — AB (ref 70–99)
Potassium: 4.3 mEq/L (ref 3.5–5.1)
Sodium: 139 mEq/L (ref 135–145)
TOTAL PROTEIN: 8.2 g/dL (ref 6.0–8.3)
Total Bilirubin: 0.8 mg/dL (ref 0.2–1.2)

## 2016-06-10 LAB — LIPID PANEL
Cholesterol: 290 mg/dL — ABNORMAL HIGH (ref 0–200)
HDL: 36.7 mg/dL — ABNORMAL LOW (ref >39.00)
NONHDL: 253.54
Total CHOL/HDL Ratio: 8
Triglycerides: 299 mg/dL — ABNORMAL HIGH (ref 0.0–149.0)
VLDL: 59.8 mg/dL — ABNORMAL HIGH (ref 0.0–40.0)

## 2016-06-10 LAB — TSH: TSH: 2.24 u[IU]/mL (ref 0.35–4.50)

## 2016-06-10 LAB — PSA: PSA: 0.35 ng/mL (ref 0.10–4.00)

## 2016-06-10 LAB — HIV ANTIBODY (ROUTINE TESTING W REFLEX): HIV 1&2 Ab, 4th Generation: NONREACTIVE

## 2016-06-10 LAB — LDL CHOLESTEROL, DIRECT: Direct LDL: 190 mg/dL

## 2016-06-10 MED ORDER — FLUTICASONE PROPIONATE 50 MCG/ACT NA SUSP: 2.0000 | Freq: Every day | NASAL | 1 refills | Status: DC

## 2016-06-10 MED FILL — FLUTICASONE PROP 50 MCG SPR: 50 | 30 days supply | Qty: 16 | Fill #0

## 2016-06-10 NOTE — Patient Instructions (Addendum)
For you wellness exam today I have ordered cbc, cmp, tsh, lipid panel, ua and hiv.  Vaccine up today.  Recommend exercise and healthy diet.  We will let you know lab results as they come in.  Follow up date appointment will be determined after lab review.   See your dentist for tooth area, refer to pulmonologist for snoring. Refer to GI for follow up colonoscopy.  Rx flonase for nasal congestion  Follow up in 3 weeks for bp check. Bring at home bp readings. Use debrox 3 days before appointment and we can try to lavage. Otherwise follow up as needed   Preventive Care 40-64 Years, Male Preventive care refers to lifestyle choices and visits with your health care provider that can promote health and wellness. What does preventive care include?  A yearly physical exam. This is also called an annual well check.  Dental exams once or twice a year.  Routine eye exams. Ask your health care provider how often you should have your eyes checked.  Personal lifestyle choices, including:  Daily care of your teeth and gums.  Regular physical activity.  Eating a healthy diet.  Avoiding tobacco and drug use.  Limiting alcohol use.  Practicing safe sex.  Taking low-dose aspirin every day starting at age 12. What happens during an annual well check? The services and screenings done by your health care provider during your annual well check will depend on your age, overall health, lifestyle risk factors, and family history of disease. Counseling  Your health care provider may ask you questions about your:  Alcohol use.  Tobacco use.  Drug use.  Emotional well-being.  Home and relationship well-being.  Sexual activity.  Eating habits.  Work and work Statistician. Screening  You may have the following tests or measurements:  Height, weight, and BMI.  Blood pressure.  Lipid and cholesterol levels. These may be checked every 5 years, or more frequently if you are over 57  years old.  Skin check.  Lung cancer screening. You may have this screening every year starting at age 8 if you have a 30-pack-year history of smoking and currently smoke or have quit within the past 15 years.  Fecal occult blood test (FOBT) of the stool. You may have this test every year starting at age 77.  Flexible sigmoidoscopy or colonoscopy. You may have a sigmoidoscopy every 5 years or a colonoscopy every 10 years starting at age 70.  Prostate cancer screening. Recommendations will vary depending on your family history and other risks.  Hepatitis C blood test.  Hepatitis B blood test.  Sexually transmitted disease (STD) testing.  Diabetes screening. This is done by checking your blood sugar (glucose) after you have not eaten for a while (fasting). You may have this done every 1-3 years. Discuss your test results, treatment options, and if necessary, the need for more tests with your health care provider. Vaccines  Your health care provider may recommend certain vaccines, such as:  Influenza vaccine. This is recommended every year.  Tetanus, diphtheria, and acellular pertussis (Tdap, Td) vaccine. You may need a Td booster every 10 years.  Varicella vaccine. You may need this if you have not been vaccinated.  Zoster vaccine. You may need this after age 7.  Measles, mumps, and rubella (MMR) vaccine. You may need at least one dose of MMR if you were born in 1957 or later. You may also need a second dose.  Pneumococcal 13-valent conjugate (PCV13) vaccine. You may need this  if you have certain conditions and have not been vaccinated.  Pneumococcal polysaccharide (PPSV23) vaccine. You may need one or two doses if you smoke cigarettes or if you have certain conditions.  Meningococcal vaccine. You may need this if you have certain conditions.  Hepatitis A vaccine. You may need this if you have certain conditions or if you travel or work in places where you may be exposed to  hepatitis A.  Hepatitis B vaccine. You may need this if you have certain conditions or if you travel or work in places where you may be exposed to hepatitis B.  Haemophilus influenzae type b (Hib) vaccine. You may need this if you have certain risk factors. Talk to your health care provider about which screenings and vaccines you need and how often you need them. This information is not intended to replace advice given to you by your health care provider. Make sure you discuss any questions you have with your health care provider. Document Released: 03/28/2015 Document Revised: 11/19/2015 Document Reviewed: 12/31/2014 Elsevier Interactive Patient Education  2017 [employer].

## 2016-06-10 NOTE — Progress Notes (Signed)
Subjective:    Patient ID: Brian Sawyer, male    DOB: 1955/07/28, 61 y.o.   MRN: [id number]  HPI   Here for cpe.  I have reviewed pt PMH, PSH, FH, Social History and Surgical History  Pt works as Charity fundraiser, pt does not exercise, admits moderate poor diet, 1 cup coffee a day, Married- 3 children.  Pt had colonoscopy in 2016. Pt states told to repeat in 3-5 years.  Tetanus up to date.   Pt will get hiv screening today.  Pt declines hep C.  Pt is fasting.  Will refer to pulmonologist for snoring and sleep apnea evaluation.   Offered to oral surgeon for tooth bleeding near prior cyst removal site. Pt states he will make apointment and see his dentist.  Pt has some poor sleep. Some decreased quality of sleep. He associates poor sleep may be  related to prior surgery for cyst left side upper tooth area. Pt also admits that he snores. Pt oral surgeon was Dr. Buelah Manis.    Review of Systems  Constitutional: Positive for fatigue.  HENT: Positive for congestion and sinus pressure. Negative for ear pain, nosebleeds, postnasal drip, rhinorrhea and sinus pain.        Occasional sinus pressure.  Respiratory: Negative for cough, chest tightness, shortness of breath and wheezing.   Cardiovascular: Negative for chest pain and palpitations.  Gastrointestinal: Negative for abdominal pain.  Musculoskeletal: Negative for back pain and neck pain.  Skin: Negative for rash.  Neurological: Negative for dizziness and headaches.  Hematological: Negative for adenopathy. Does not bruise/bleed easily.  Psychiatric/Behavioral: Positive for sleep disturbance. Negative for behavioral problems and confusion.   Past Medical History:  Diagnosis Date  . Difficult intubation    on 04/17/03 [facility] MDA rec: awake or asleep fiberoptic intubation, kidney surgery  . Diverticulosis   . GERD (gastroesophageal reflux disease)   . Gout   . History of kidney cancer 2006   left   . Hypertension   . Kidney  stones    right     Social History   Social History  . Marital status: Married    Spouse name: N/A  . Number of children: N/A  . Years of education: N/A   Occupational History  . Not on file.   Social History Main Topics  . Smoking status: Former Smoker    Years: 4.00    Types: Cigarettes    Quit date: 04/07/1976  . Smokeless tobacco: Never Used  . Alcohol use 0.0 oz/week     Comment: rare  . Drug use: No  . Sexual activity: Not on file   Other Topics Concern  . Not on file   Social History Narrative  . No narrative on file    Past Surgical History:  Procedure Laterality Date  . COLONOSCOPY WITH PROPOFOL N/A 07/04/2014   Procedure: COLONOSCOPY WITH PROPOFOL;  Surgeon: Milus Banister, MD;  Location: [facility];  Service: Endoscopy;  Laterality: N/A;  . 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 EXCISION OF ORAL TUMOR    . PARTIAL NEPHRECTOMY Left 2006  . SURGERY SCROTAL / TESTICULAR  1980s  . TOOTH EXTRACTION N/A 05/14/2013   Procedure: REMOVAL OF SUPERNUMERARY TOOTH NUMBER FIFTY-NINE WITH BONE GRAFTING;  Surgeon: Isac Caddy, DDS;  Location: [place];  Service:  Oral Surgery;  Laterality: N/A;    Family History  Problem Relation Age of Onset  . Adopted: Yes    No Known Allergies  Current Outpatient Prescriptions on File Prior to Visit  Medication Sig Dispense Refill  . allopurinol (ZYLOPRIM) 300 MG tablet Take 300 mg by mouth daily.    . hydrochlorothiazide (HYDRODIURIL) 25 MG tablet Take 25 mg by mouth every morning.     . ranitidine (ZANTAC) 150 MG capsule Take 150 mg by mouth daily as needed for heartburn.    . simvastatin (ZOCOR) 20 MG tablet Take 1 tablet (20 mg total) by mouth at bedtime. (Patient not taking: Reported on 06/10/2016) 30 tablet 5   No current  facility-administered medications on file prior to visit.     BP (!) 123/97 (BP Location: Right Arm, Patient Position: Sitting, Cuff Size: Large)   Pulse 78   Temp 97.5 F (36.4 C) (Oral)   Resp 16   Ht 6\' 2"  (1.88 m)   Wt 237 lb 2 oz (107.6 kg)   SpO2 96%   BMI 30.45 kg/m       Objective:   Physical Exam  General Mental Status- Alert. General Appearance- Not in acute distress.    HEENT Head- Normal. Ear Wax blockage bilaterally. Eye Sclera/Conjunctiva- Left- Normal. Right- Normal. Nose & Sinuses Nasal Mucosa- Left-  Boggy and Congested. Right-  Boggy and  Congested.Bilateral maxillary and frontal sinus pressure. Mouth & Throat Lips: Upper Lip- Normal: no dryness, cracking, pallor, cyanosis, or vesicular eruption. Lower Lip-Normal: no dryness, cracking, pallor, cyanosis or vesicular eruption. Buccal Mucosa- Bilateral- No Aphthous ulcers. Oropharynx- No Discharge or Erythema. Tonsils: Characteristics- Bilateral- No Erythema or Congestion. Size/Enlargement- Bilateral- No enlargement. Discharge- bilateral-None.  .  Lymphatic Head & Neck General Head & Neck Lymphatics: Bilateral: Description- No Localized lymphadenopathy.   Chest and Lung Exam Auscultation: Breath Sounds:-Normal.  Cardiovascular Auscultation:Rythm- Regular. Murmurs & Other Heart Sounds:Auscultation of the heart reveals- No Murmurs.  Abdomen Inspection:-Inspeection Normal. Palpation/Percussion:Note:No mass. Palpation and Percussion of the abdomen reveal- Non Tender, Non Distended + BS, no rebound or guarding.  Neurologic Cranial Nerve exam:- CN III-XII intact(No nystagmus), symmetric smile. Strength:- 5/5 equal and symmetric strength both upper and lower extremities.  Genital exam- normal. No hernia. Normal testicle.s Rectal- normal prostate on exam. No nodules. Normal sphincter tone.(hemoccult card was negative for blood)      Assessment & Plan:  For you wellness exam today I have ordered  cbc, cmp, tsh, lipid panel, ua and hiv.  Vaccine not given today.  Recommend exercise and healthy diet.  We will let you know lab results as they come in.  Follow up date appointment will be determined after lab review.   See your dentist for tooth area, refer to pulmonologist for snoring. Refer to GI for follow up colonoscopy.  Rx flonase for nasal congestion  Follow up in 3 weeks for bp check. Bring at home bp readings. Use debrox 3 days before appointment and we can try to lavage. Otherwise follow up as needed   Rodriquez Thorner, Percell Miller, [employer]

## 2016-06-10 NOTE — Telephone Encounter (Signed)
Opened for review. 

## 2016-06-10 NOTE — Progress Notes (Signed)
Pre visit review using our clinic review tool, if applicable. No additional management support is needed unless otherwise documented below in the visit note/SLS  

## 2016-06-14 ENCOUNTER — Telehealth: Payer: Self-pay | Admitting: *Deleted

## 2016-06-14 MED ORDER — ATORVASTATIN CALCIUM 20 MG PO TABS: 20.0000 mg | ORAL_TABLET | Freq: Every day | ORAL | 2 refills | Status: DC

## 2016-06-14 MED FILL — ATORVASTATIN 20 MG TABLET: 20 | 30 days supply | Qty: 30 | Fill #0

## 2016-06-14 NOTE — Telephone Encounter (Signed)
-----   Message from [name], PA-C sent at 06/11/2016  7:36 AM EDT ----- hiv study was negative. Sugar was mild elevated on labs. Low sugar diet recommended.Good kidney function. Very high total cholesterol and high ldl. Looks like he has not been on zocor since has not been in for a while and he had limited refill. Verify not using zocor. If not using will you send in atorvastatin 20 mg tabs #30 1 tab po a day. Refill for 2 months. Repeat lipid panel in 3 months fasting. Thyroid study normal.psa normal and cbc normal(no anemia and normal infection fighting cells). When in for follow up in 3 weeks for bp check will get 3 month blood sugar average lab.

## 2016-06-14 NOTE — Telephone Encounter (Signed)
Patient informed, understood & agreed; Atorvastatin 20 mg sent to pharmacy at patient request Brian Sawyer he had side effects/intolerance ['made him feel bad' to the Zocor in the past and does not wish to take it anymore], add to Allergy list as intolerance/SLS 04/02

## 2016-06-24 DIAGNOSIS — L570 Actinic keratosis: Secondary | ICD-10-CM | POA: Diagnosis not present

## 2016-06-24 DIAGNOSIS — L720 Epidermal cyst: Secondary | ICD-10-CM | POA: Diagnosis not present

## 2016-07-01 ENCOUNTER — Ambulatory Visit (INDEPENDENT_AMBULATORY_CARE_PROVIDER_SITE_OTHER): Payer: 59 | Admitting: Medical

## 2016-07-01 ENCOUNTER — Encounter: Payer: Self-pay | Admitting: Medical

## 2016-07-01 VITALS — BP 130/80 | HR 84 | Temp 97.7°F | Resp 16 | Ht 76.0 in | Wt 234.8 lb

## 2016-07-01 DIAGNOSIS — I10 Essential (primary) hypertension: Secondary | ICD-10-CM | POA: Diagnosis not present

## 2016-07-01 MED ORDER — LOSARTAN POTASSIUM-HCTZ 100-12.5 MG PO TABS: 1.0000 | ORAL_TABLET | Freq: Every day | ORAL | 0 refills | Status: DC

## 2016-07-01 NOTE — Progress Notes (Signed)
Subjective:    Patient ID: Brian Sawyer, male    DOB: March 24, 1955, 61 y.o.   MRN: [id number]  HPI   Pt in for follow up.  Pt has been taking his blood pressure readings at home. Pt has his own electronic bp cuff.   Pt bp 139/89 and 134/85.(was wife reading.) Pt wife is a Marine scientist.  Pt at home bp cuff electronic. His bp reading area varied with his readings. Not new machine. More than 61 years old. Pt has highs up to 161/99. And low of 123/79. Most in between.  Pt has hypertension in past. On hx pt had been on hctz per epic.  Pt started cholesterol meds. He stats exercising some.   Review of Systems  Constitutional: Negative for chills, fatigue and fever.  Respiratory: Negative for cough, chest tightness and shortness of breath.   Cardiovascular: Negative for chest pain and palpitations.  Gastrointestinal: Negative for abdominal pain.  Musculoskeletal: Negative for back pain.  Neurological: Negative for dizziness, seizures, weakness, light-headedness and headaches.  Hematological: Negative for adenopathy. Does not bruise/bleed easily.  Psychiatric/Behavioral: Negative for behavioral problems.   Past Medical History:  Diagnosis Date  . Difficult intubation    on 04/17/03 [facility] MDA rec: awake or asleep fiberoptic intubation, kidney surgery  . Diverticulosis   . GERD (gastroesophageal reflux disease)   . Gout   . History of kidney cancer 2006   left   . Hypertension   . Kidney stones    right     Social History   Social History  . Marital status: Married    Spouse name: N/A  . Number of children: N/A  . Years of education: N/A   Occupational History  . Not on file.   Social History Main Topics  . Smoking status: Former Smoker    Years: 4.00    Types: Cigarettes    Quit date: 04/07/1976  . Smokeless tobacco: Never Used  . Alcohol use 0.0 oz/week     Comment: rare  . Drug use: No  . Sexual activity: Not on file   Other Topics Concern  . Not on file    Social History Narrative  . No narrative on file    Past Surgical History:  Procedure Laterality Date  . COLONOSCOPY WITH PROPOFOL N/A 07/04/2014   Procedure: COLONOSCOPY WITH PROPOFOL;  Surgeon: Milus Banister, MD;  Location: [facility];  Service: Endoscopy;  Laterality: N/A;  . 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 EXCISION OF ORAL TUMOR    . PARTIAL NEPHRECTOMY Left 2006  . SURGERY SCROTAL / TESTICULAR  1980s  . TOOTH EXTRACTION N/A 05/14/2013   Procedure: REMOVAL OF SUPERNUMERARY TOOTH NUMBER FIFTY-NINE WITH BONE GRAFTING;  Surgeon: Isac Caddy, DDS;  Location: [place];  Service: Oral Surgery;  Laterality: N/A;    Family History  Problem Relation Age of Onset  . Adopted: Yes    Allergies  Allergen Reactions  . Zocor [Simvastatin] Other (See Comments)    Patient states 'made him feel bad'    Current Outpatient Prescriptions on File Prior to Visit  Medication Sig Dispense Refill  . allopurinol (ZYLOPRIM) 300 MG tablet Take 300 mg by mouth daily.    Marland Kitchen atorvastatin (LIPITOR) 20 MG tablet Take 1 tablet (20 mg total) by mouth daily. [place]  tablet 2  . fluticasone (FLONASE) 50 MCG/ACT nasal spray Place 2 sprays into both nostrils daily. 16 g 1  . hydrochlorothiazide (HYDRODIURIL) 25 MG tablet Take 25 mg by mouth every morning.     . ranitidine (ZANTAC) 150 MG capsule Take 150 mg by mouth daily as needed for heartburn.     No current facility-administered medications on file prior to visit.     BP 122/70 (BP Location: Right Arm, Patient Position: Sitting, Cuff Size: Large)   Pulse 84   Temp 97.7 F (36.5 C) (Oral)   Resp 16   Ht 6\' 4"  (1.93 m)   Wt 234 lb 12.8 oz (106.5 kg)   SpO2 97%   BMI 28.58 kg/m       Objective:   Physical Exam  General Mental Status- Alert. General  Appearance- Not in acute distress.   Skin General: Color- Normal Color. Moisture- Normal Moisture.  Ears- rt side canal appears to have less wax. Left side- wax looks softer and mildly less as well.  Neck Carotid Arteries- Normal color. Moisture- Normal Moisture. No carotid bruits. No JVD.  Chest and Lung Exam Auscultation: Breath Sounds:-Normal.  Cardiovascular Auscultation:Rythm- Regular. Murmurs & Other Heart Sounds:Auscultation of the heart reveals- No Murmurs.  Neurologic Cranial Nerve exam:- CN III-XII intact(No nystagmus), symmetric smile. Strength:- 5/5 equal and symmetric strength both upper and lower extremities.      Assessment & Plan:  Your bp in office today when I checked and bp with wife(nurse) is acceptable/good.   Bp at home with machine is questionable.  I want you to continue hctz 25 mg a day. Have your wife check bp daily for 7 days. If bp close to 140/90 or above then start print rx losartan/hctz 100/12.5 and stop hctz 25 mg. If bp less than 140/90 consistently then stay on hctz 25 mg.  If you do start combo medication then please notify me.  Can continue debrox for wax. If you want ear lavage just call and set that up.  Follow up in 3 months or as needed.  Giana Castner, Percell Miller, PA-C

## 2016-07-01 NOTE — Patient Instructions (Addendum)
Your bp in office today when I checked and bp with wife(nurse) is acceptable/good.   Bp at home with machine is questionable.  I want you to continue hctz 25 mg a day. Have your wife check bp daily for 7 days. If bp close to 140/90 or above then start print rx losartan/hctz 100/12.5 and stop hctz 25 mg. If bp less than 140/90 consistently then stay on hctz 25 mg.  If you do start combo medication then please notify me.  Can continue debrox for wax. If you want ear lavage just call and set that up.  Follow up in 3 months or as needed.

## 2016-07-04 MED FILL — HYDROCHLOROTHIAZIDE 25 MG T: 25 | 30 days supply | Qty: 30 | Fill #6

## 2016-07-04 MED FILL — ALLOPURINOL 300 MG TAB: 300 | 30 days supply | Qty: 30 | Fill #6

## 2016-07-12 MED FILL — ATORVASTATIN 20 MG TABLET: 20 | 30 days supply | Qty: 30 | Fill #1

## 2016-08-03 MED FILL — HYDROCHLOROTHIAZIDE 25 MG T: 25 | 30 days supply | Qty: 30 | Fill #7

## 2016-08-03 MED FILL — ALLOPURINOL 300 MG TAB: 300 | 30 days supply | Qty: 30 | Fill #7

## 2016-08-04 ENCOUNTER — Ambulatory Visit (INDEPENDENT_AMBULATORY_CARE_PROVIDER_SITE_OTHER): Payer: 59 | Admitting: Pulmonary Disease

## 2016-08-04 ENCOUNTER — Encounter: Payer: Self-pay | Admitting: Pulmonary Disease

## 2016-08-04 DIAGNOSIS — R059 Cough, unspecified: Secondary | ICD-10-CM

## 2016-08-04 DIAGNOSIS — G4710 Hypersomnia, unspecified: Secondary | ICD-10-CM | POA: Insufficient documentation

## 2016-08-04 DIAGNOSIS — R05 Cough: Secondary | ICD-10-CM

## 2016-08-04 NOTE — Assessment & Plan Note (Signed)
Start flonase 2 sq/nostril

## 2016-08-04 NOTE — Patient Instructions (Signed)
It was a pleasure taking care of you today!  We will schedule you to have a sleep study to determine if you have sleep apnea.   We will get a home sleep test.  You will be instructed to come back to the office to get an apparatus to sleep with overnight.  Once we have the apparatus, it will usually take [place] 1-2 weeks to read the study and get back at you with results of the test.  Please give [place] a call in 2 weeks after your study if you do not hear back from [place].    If the sleep study is positive, we will order you a CPAP  machine.  Please call the office if you do NOT receive your machine in the next 1-2 weeks.   Please make sure you use your CPAP device everytime you sleep.  We will monitor the usage of your machine per your insurance requirement.  Your insurance company may take the machine from you if you are not using it regularly.   Please clean the mask, tubings, filter, water reservoir with soapy water every week.  Please use distilled water for the water reservoir.   Please call the office or your machine provider (DME company) if you are having issues with the device.   Return to clinic in 8-10 weeks with  NP

## 2016-08-04 NOTE — Assessment & Plan Note (Addendum)
Pt  has snoring, occasional witnessed apneas. He goes to sleep at 10-11 pm, wakes up couple of times for no apparent reason, then gets up at 6:30am. He sleeps 7.5 - 8 hrs per night. He sleeps lightly.  Denies gasping or choking. (-) abnormal behavior in sleep. He has hypersomnia in am.  He can get sleepy in pm.  Hypersomnia affects his fxnality.   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  He lives in [place]. Wife is a Marine scientist at [employer].  He recently had dental work late last year.  He ended up developing a cyst in his upper gums. He eventually was diagnosed with an extra tooth which was subsequently removed. He also ended up getting "extra bone".  Since having  His dental extraction, his hypersomnia and other sx has gotten worse. Also with L nasal congestion recently.   ESS 11   Plan :  We discussed about the diagnosis of Obstructive Sleep Apnea (OSA) and implications of untreated OSA. We discussed about CPAP and BiPaP as possible treatment options.    We will schedule the patient for a sleep study.  He is symptomatic, especially after having dental work done. He has crowded airway as well as an underbite. Anticipate no issues with CPAP. Plan to start auto CPAP unless he has severe sleep apnea. He has nasal congestion for which I told him to start using Flonase. Wife is a Marine scientist who works at [employer].   Patient was instructed to call the office if he/she has not heard back from the office 1-2 weeks after the sleep study.   Patient was instructed to call the office if he/she is having issues with the PAP device.   We discussed good sleep hygiene.   Patient was advised not to engage in activities requiring concentration and/or vigilance if he/she is sleepy.  Patient was advised not to drive if he/she is sleepy.

## 2016-08-04 NOTE — Progress Notes (Signed)
Subjective:    Patient ID: Brian Sawyer, male    DOB: [birth year], 61 y.o.   MRN: [id number]  HPI   This is the case of Brian Sawyer, 61 y.o. Male, who was referred by Dr. [name]  in consultation regarding OSA   As you very well know, patient smoked for 5 yrs in his 18s, not been diagnosed with asthma or copd.   He has snoring, occasional witnessed apneas. He goes to sleep at 10-11 pm, wakes up couple of times for no apparent reason, then gets up at 6:30am. He sleeps 7.5 - 8 hrs per night. He sleeps lightly.  Denies gasping or choking. (-) abnormal behavior in sleep. He has hypersomnia in am.  He can get sleepy in pm.  Hypersomnia affects his fxnality.   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   He recently had dental work late last year.  He ended up developing a cyst in his upper gums. He eventually was diagnosed with an extra tooth which was subsequently removed. He also ended up getting "extra bone".  Since having  His dental extraction, his hypersomnia and other sx has gotten worse. Also with L nasal congestion recently.   ESS 11  Review of Systems  Constitutional: Negative.  Negative for fever and unexpected weight change.  HENT: Positive for congestion and dental problem. Negative for ear pain, nosebleeds, postnasal drip, rhinorrhea, sinus pressure, sneezing, sore throat and trouble swallowing.   Eyes: Negative.  Negative for redness and itching.  Respiratory: Negative.  Negative for cough, chest tightness, shortness of breath and wheezing.   Cardiovascular: Negative.  Negative for palpitations and leg swelling.  Gastrointestinal: Negative.  Negative for nausea and vomiting.  Endocrine: Negative.   Genitourinary: Negative.  Negative for dysuria.  Musculoskeletal: Negative.  Negative for joint swelling.  Skin: Negative.  Negative for rash.  Allergic/Immunologic: Negative.  Negative for environmental allergies, food allergies and  immunocompromised state.  Neurological: Negative.  Negative for headaches.  Hematological: Negative.  Does not bruise/bleed easily.  Psychiatric/Behavioral: Negative.  Negative for dysphoric mood. The patient is not nervous/anxious.    Past Medical History:  Diagnosis Date  . Difficult intubation    on 04/17/03 [facility] MDA rec: awake or asleep fiberoptic intubation, kidney surgery  . Diverticulosis   . GERD (gastroesophageal reflux disease)   . Gout   . History of kidney cancer 2006   left   . Hypertension   . Kidney stones    right   (-) CA, Had kidney L CA removed in 2006.   Family History  Problem Relation Age of Onset  . Adopted: Yes     Past Surgical History:  Procedure Laterality Date  . COLONOSCOPY WITH PROPOFOL N/A 07/04/2014   Procedure: COLONOSCOPY WITH PROPOFOL;  Surgeon: Milus Banister, MD;  Location: [facility];  Service: Endoscopy;  Laterality: N/A;  . EAR CYST EXCISION N/A 05/14/2013   Procedure: 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;  Surgeon: Isac Caddy, DDS;  Location: [place];  Service: Oral Surgery;  Laterality: N/A;  . EXCISION OF ORAL TUMOR    . PARTIAL NEPHRECTOMY Left 2006  . SURGERY SCROTAL / TESTICULAR  1980s  . TOOTH EXTRACTION N/A 05/14/2013   Procedure: REMOVAL OF SUPERNUMERARY TOOTH NUMBER FIFTY-NINE WITH BONE GRAFTING;  Surgeon: Isac Caddy, DDS;  Location: [place];  Service: Oral Surgery;  Laterality: N/A;    Social  History   Social History  . Marital status: Married    Spouse name: N/A  . Number of children: N/A  . Years of education: N/A   Occupational History  . Not on file.   Social History Main Topics  . Smoking status: Former Smoker    Years: 4.00    Types: Cigarettes    Quit date: 04/07/1976  . Smokeless tobacco: Never Used  . Alcohol use 0.0 oz/week     Comment: rare  . Drug use: No  . Sexual activity: Not on file   Other Topics Concern  . Not on file   Social  History Narrative  . No narrative on file     Allergies  Allergen Reactions  . Zocor [Simvastatin] Other (See Comments)    Patient states 'made him feel bad'     Outpatient Medications Prior to Visit  Medication Sig Dispense Refill  . allopurinol (ZYLOPRIM) 300 MG tablet Take 300 mg by mouth daily.    Marland Kitchen atorvastatin (LIPITOR) 20 MG tablet Take 1 tablet (20 mg total) by mouth daily. 30 tablet 2  . fluticasone (FLONASE) 50 MCG/ACT nasal spray Place 2 sprays into both nostrils daily. 16 g 1  . hydrochlorothiazide (HYDRODIURIL) 25 MG tablet Take 25 mg by mouth every morning.     . ranitidine (ZANTAC) 150 MG capsule Take 150 mg by mouth daily as needed for heartburn.     No facility-administered medications prior to visit.    No orders of the defined types were placed in this encounter.       Objective:   Physical Exam  Vitals:  Vitals:   08/04/16 1455  BP: 130/82  Pulse: 89  SpO2: 96%  Weight: 238 lb 12.8 oz (108.3 kg)  Height: 6\' 4"  (1.93 m)    Constitutional/General:  Pleasant, well-nourished, well-developed, not in any distress,  Comfortably seating.  Well kempt  Body mass index is 29.07 kg/m. Wt Readings from Last 3 Encounters:  08/04/16 238 lb 12.8 oz (108.3 kg)  07/01/16 234 lb 12.8 oz (106.5 kg)  06/10/16 237 lb 2 oz (107.6 kg)     HEENT: Pupils equal and reactive to light and accommodation. Anicteric sclerae. Normal nasal mucosa.   No oral  lesions,  mouth clear,  oropharynx clear, no postnasal drip. (-) Oral thrush. No dental caries. Has an underbite Airway - Mallampati class III-IV  Neck: No masses. Midline trachea. No JVD, (-) LAD. (-) bruits appreciated.  Respiratory/Chest: Grossly normal chest. (-) deformity. (-) Accessory muscle use.  Symmetric expansion. (-) Tenderness on palpation.  Resonant on percussion.  Diminished BS on both lower lung zones. (-) wheezing, crackles, rhonchi (-) egophony  Cardiovascular: Regular rate and  rhythm, heart  sounds normal, no murmur or gallops, no peripheral edema  Gastrointestinal:  Normal bowel sounds. Soft, non-tender. No hepatosplenomegaly.  (-) masses.   Musculoskeletal:  Normal muscle tone. Normal gait.   Extremities: Grossly normal. (-) clubbing, cyanosis.  (-) edema  Skin: (-) rash,lesions seen.   Neurological/Psychiatric : alert, oriented to time, place, person. Normal mood and affect          Assessment & Plan:  Hypersomnia Pt  has snoring, occasional witnessed apneas. He goes to sleep at 10-11 pm, wakes up couple of times for no apparent reason, then gets up at 6:30am. He sleeps 7.5 - 8 hrs per night. He sleeps lightly.  Denies gasping or choking. (-) abnormal behavior in sleep. He has hypersomnia in am.  He can get  sleepy in pm.  Hypersomnia affects his fxnality.   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 Wife is a Marine scientist at [employer].  He recently had dental work late last year.  He ended up developing a cyst in his upper gums. He eventually was diagnosed with an extra tooth which was subsequently removed. He also ended up getting "extra bone".  Since having  His dental extraction, his hypersomnia and other sx has gotten worse. Also with L nasal congestion recently.   ESS 11   Plan :  We discussed about the diagnosis of Obstructive Sleep Apnea (OSA) and implications of untreated OSA. We discussed about CPAP and BiPaP as possible treatment options.    We will schedule the patient for a sleep study.  He is symptomatic, especially after having dental work done. He has crowded airway as well as an underbite. Anticipate no issues with CPAP. Plan to start auto CPAP unless he has severe sleep apnea. He has nasal congestion for which I told him to start using Flonase. Wife is a Marine scientist who works at [employer].   Patient was instructed to call the office if he/she has not heard back from the office 1-2 weeks after the sleep study.    Patient was instructed to call the office if he/she is having issues with the PAP device.   We discussed good sleep hygiene.   Patient was advised not to engage in activities requiring concentration and/or vigilance if he/she is sleepy.  Patient was advised not to drive if he/she is sleepy.    Cough Start flonase 2 sq/nostril     Thank you very much for letting me participate in this patient's care. Please do not hesitate to give me a call if you have any questions or concerns regarding the treatment plan.   Patient will follow up with [place] in 8-10 weeks    J. Shirl Harris, MD 08/04/2016   3:59 PM Pulmonary and [place] Pager: [phone] Office: [phone], Fax: [phone]

## 2016-08-05 MED FILL — FLUTICASONE PROP 50 MCG SPR: 50 | 30 days supply | Qty: 16 | Fill #1

## 2016-08-11 MED FILL — ATORVASTATIN 20 MG TABLET: 20 | 30 days supply | Qty: 30 | Fill #2

## 2016-08-23 DIAGNOSIS — G4733 Obstructive sleep apnea (adult) (pediatric): Secondary | ICD-10-CM | POA: Diagnosis not present

## 2016-08-27 DIAGNOSIS — G4733 Obstructive sleep apnea (adult) (pediatric): Secondary | ICD-10-CM

## 2016-08-30 ENCOUNTER — Other Ambulatory Visit: Payer: Self-pay | Admitting: *Deleted

## 2016-08-30 DIAGNOSIS — G4710 Hypersomnia, unspecified: Secondary | ICD-10-CM

## 2016-08-31 MED FILL — ALLOPURINOL 300 MG TAB: 300 | 30 days supply | Qty: 30 | Fill #8

## 2016-08-31 MED FILL — HYDROCHLOROTHIAZIDE 25 MG T: 25 | 30 days supply | Qty: 30 | Fill #8

## 2016-09-02 ENCOUNTER — Telehealth: Payer: Self-pay | Admitting: Acute Care

## 2016-09-02 DIAGNOSIS — G4733 Obstructive sleep apnea (adult) (pediatric): Secondary | ICD-10-CM

## 2016-09-02 NOTE — Telephone Encounter (Signed)
Pt requesting home sleep test results.  HST in Epic. Spoke with KW who advised for me to send to [facility] for recs.  CY please advise on HST.  Thanks!

## 2016-09-03 NOTE — Telephone Encounter (Signed)
Spoke with pt. He is aware of results. Order has been placed for CPAP. I attempted to make his ROV but he states that he will call us for this appointment. Nothing further was needed.

## 2016-09-03 NOTE — Telephone Encounter (Signed)
His home sleep study showed moderately severe obstructive sleep apnea, stopping breathing 21 times/ hour. Recommend order new DME, new CPAP auto 5-20, mask of choice, humidifier, supplies, [facility] dx OSA  He will need an ov for CPAP follow-up in 31-90 days to see how it is working.

## 2016-09-08 ENCOUNTER — Telehealth: Payer: Self-pay | Admitting: Pulmonary Disease

## 2016-09-08 NOTE — Telephone Encounter (Signed)
Spoke with pt, states he was contacted by [facility] stating that they needed more clinical documentation before they can provide him with a cpap machine.   Called [facility], was told by rep that they were requiring a copy of clinical notes, sleep test, and rx for cpap including rx, dx code, MD's npi, and MD's signature before they can process order.  Order would need to be faxed to [facility] at [phone].  Per chart, this was faxed by [facility] on 6/22 with a confirmation received from [facility].  PCC's please advise if you can refax complete order for pt's cpap to [facility].  Thanks!

## 2016-09-08 NOTE — Telephone Encounter (Signed)
Records have been refaxed to [facility] at the number requested. Confirmation received from [place].

## 2016-09-09 ENCOUNTER — Other Ambulatory Visit: Payer: Self-pay | Admitting: Medical

## 2016-09-13 ENCOUNTER — Other Ambulatory Visit: Payer: Self-pay | Admitting: Pulmonary Disease

## 2016-09-13 ENCOUNTER — Telehealth: Payer: Self-pay | Admitting: Pulmonary Disease

## 2016-09-13 ENCOUNTER — Other Ambulatory Visit: Payer: Self-pay | Admitting: Medical

## 2016-09-13 DIAGNOSIS — G4733 Obstructive sleep apnea (adult) (pediatric): Secondary | ICD-10-CM | POA: Diagnosis not present

## 2016-09-13 MED ORDER — FLUTICASONE PROPIONATE 50 MCG/ACT NA SUSP: 2.0000 | Freq: Every day | NASAL | 1 refills | Status: DC

## 2016-09-13 MED FILL — FLUTICASONE PROP 50 MCG SPR: 50 | 30 days supply | Qty: 16 | Fill #0

## 2016-09-13 NOTE — Telephone Encounter (Signed)
Self.   Refill request for atorvastatin    Pharmacy: [place], [place]   Pt is completely out.

## 2016-09-13 NOTE — Telephone Encounter (Signed)
Spoke with pt. He is requesting a refill on Flonase. Rx has been sent in. Nothing further was needed.

## 2016-09-13 NOTE — Telephone Encounter (Signed)
Called [facility] & spoke to [place].  She states she has all the info she needs to process the order.  She states she will process herself today & call pt.  I told her I would call & let him know because he is wanting to change dme's.  I called pt & left him a vm letting him know [facility] has all they need & Kenney Houseman will call him today.  Told him to call me if he still wants to change dme's.  Called Tonya back & told her.  She states she will get on this now & call me so he will not have to call me back.  Nothing further needed.

## 2016-09-14 MED ORDER — ATORVASTATIN CALCIUM 20 MG PO TABS: 20.0000 mg | ORAL_TABLET | Freq: Every day | ORAL | 0 refills | Status: DC

## 2016-09-14 MED FILL — ATORVASTATIN 20 MG TABLET: 20 | 30 days supply | Qty: 30 | Fill #0

## 2016-09-14 NOTE — Telephone Encounter (Addendum)
Reviewed patient's chart.  Pt needs a follow up appt for HTN and hyperlipidemia with Percell Miller.  Appt scheduled.  Refill phoned into pharmacy.

## 2016-09-14 NOTE — Telephone Encounter (Signed)
Relation to FH:LKTG Call back number: Pharmacy: [place], [place] - [place] [phone] (Phone) [phone] (Fax)     Reason for call:  Patient is upset he's completley out of atorvastatin (LIPITOR) 20 MG tablet, patient going out of town today and would like Rx sent as soon as possible,please advise

## 2016-09-14 NOTE — Addendum Note (Signed)
Addended by: Rudene Anda on: 09/14/2016 04:09 PM   Modules accepted: Orders

## 2016-09-28 ENCOUNTER — Ambulatory Visit: Payer: 59 | Admitting: Medical

## 2016-09-28 MED FILL — HYDROCHLOROTHIAZIDE 25 MG T: 25 | 30 days supply | Qty: 30 | Fill #0

## 2016-09-30 ENCOUNTER — Telehealth: Payer: Self-pay | Admitting: Medical

## 2016-09-30 ENCOUNTER — Encounter: Payer: Self-pay | Admitting: Medical

## 2016-09-30 ENCOUNTER — Ambulatory Visit (INDEPENDENT_AMBULATORY_CARE_PROVIDER_SITE_OTHER): Payer: Self-pay | Admitting: Medical

## 2016-09-30 VITALS — BP 128/79 | HR 72 | Temp 97.9°F | Resp 16 | Ht 73.0 in | Wt 240.4 lb

## 2016-09-30 DIAGNOSIS — I10 Essential (primary) hypertension: Secondary | ICD-10-CM | POA: Diagnosis not present

## 2016-09-30 DIAGNOSIS — G4730 Sleep apnea, unspecified: Secondary | ICD-10-CM

## 2016-09-30 DIAGNOSIS — R739 Hyperglycemia, unspecified: Secondary | ICD-10-CM

## 2016-09-30 DIAGNOSIS — E785 Hyperlipidemia, unspecified: Secondary | ICD-10-CM | POA: Diagnosis not present

## 2016-09-30 LAB — COMPREHENSIVE METABOLIC PANEL
ALT: 30 U/L (ref 0–53)
AST: 40 U/L — ABNORMAL HIGH (ref 0–37)
Albumin: 4.5 g/dL (ref 3.5–5.2)
Alkaline Phosphatase: 107 U/L (ref 39–117)
BUN: 13 mg/dL (ref 6–23)
CO2: 28 mEq/L (ref 19–32)
Calcium: 9.4 mg/dL (ref 8.4–10.5)
Chloride: 103 mEq/L (ref 96–112)
Creatinine, Ser: 0.89 mg/dL (ref 0.40–1.50)
GFR: 92.37 mL/min (ref >60.00)
Glucose, Bld: 118 mg/dL — ABNORMAL HIGH (ref 70–99)
Potassium: 3.8 mEq/L (ref 3.5–5.1)
Sodium: 138 mEq/L (ref 135–145)
Total Bilirubin: 0.7 mg/dL (ref 0.2–1.2)
Total Protein: 7.5 g/dL (ref 6.0–8.3)

## 2016-09-30 LAB — LIPID PANEL
Cholesterol: 159 mg/dL (ref 0–200)
HDL: 36.5 mg/dL — ABNORMAL LOW (ref >39.00)
LDL Cholesterol: 90 mg/dL (ref 0–99)
NONHDL: 122.02
Total CHOL/HDL Ratio: 4
Triglycerides: 161 mg/dL — ABNORMAL HIGH (ref 0.0–149.0)
VLDL: 32.2 mg/dL (ref 0.0–40.0)

## 2016-09-30 MED ORDER — ZOSTER VAC RECOMB ADJUVANTED 50 MCG/0.5ML IM SUSR: 0.5000 mL | Freq: Once | INTRAMUSCULAR | 0 refills | Status: AC

## 2016-09-30 MED ORDER — ATORVASTATIN CALCIUM 20 MG PO TABS: 20.0000 mg | ORAL_TABLET | Freq: Every day | ORAL | 1 refills | Status: DC

## 2016-09-30 NOTE — Telephone Encounter (Signed)
a1c order placed

## 2016-09-30 NOTE — Progress Notes (Signed)
Subjective:    Patient ID: Brian Sawyer, male    DOB: [birth year], 61 y.o.   MRN: [id number]  HPI  Pt in for follow up.  Pt bp has been less 140/90 consistently. He is only on the hctz. He did not have to advance to combination med. No cardiac or neurologic signs or symptoms.  Pt states dx sleep apnea since last visit. Saw specialist and given cpap. Pt states still trying to get used to machine.  Pt fasting today and will get lipid panel.  Seeing urologist in October.    Review of Systems  Constitutional: Negative for chills, fatigue and fever.  Respiratory: Negative for cough and chest tightness.   Cardiovascular: Negative for chest pain and palpitations.  Gastrointestinal: Negative for abdominal pain, blood in stool, constipation, diarrhea and vomiting.  Genitourinary: Negative for discharge, dysuria, flank pain, frequency, genital sores, penile pain, scrotal swelling and urgency.  Musculoskeletal: Negative for back pain.  Skin: Negative for rash.  Neurological: Negative for dizziness, syncope, weakness and headaches.  Hematological: Negative for adenopathy. Does not bruise/bleed easily.  Psychiatric/Behavioral: Negative for behavioral problems and confusion.    Past Medical History:  Diagnosis Date  . Difficult intubation    on 04/17/03 [facility] MDA rec: awake or asleep fiberoptic intubation, kidney surgery  . Diverticulosis   . GERD (gastroesophageal reflux disease)   . Gout   . History of kidney cancer 2006   left   . Hypertension   . Kidney stones    right     Social History   Social History  . Marital status: Married    Spouse name: N/A  . Number of children: N/A  . Years of education: N/A   Occupational History  . Not on file.   Social History Main Topics  . Smoking status: Former Smoker    Years: 4.00    Types: Cigarettes    Quit date: 04/07/1976  . Smokeless tobacco: Never Used  . Alcohol use 0.0 oz/week     Comment: rare  . Drug use: No  .  Sexual activity: Not on file   Other Topics Concern  . Not on file   Social History Narrative  . No narrative on file    Past Surgical History:  Procedure Laterality Date  . COLONOSCOPY WITH PROPOFOL N/A 07/04/2014   Procedure: COLONOSCOPY WITH PROPOFOL;  Surgeon: Milus Banister, MD;  Location: [facility];  Service: Endoscopy;  Laterality: N/A;  . EAR CYST EXCISION N/A 05/14/2013   Procedure: 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;  Surgeon: Isac Caddy, DDS;  Location: [place];  Service: Oral Surgery;  Laterality: N/A;  . EXCISION OF ORAL TUMOR    . PARTIAL NEPHRECTOMY Left 2006  . SURGERY SCROTAL / TESTICULAR  1980s  . TOOTH EXTRACTION N/A 05/14/2013   Procedure: REMOVAL OF SUPERNUMERARY TOOTH NUMBER FIFTY-NINE WITH BONE GRAFTING;  Surgeon: Isac Caddy, DDS;  Location: [place];  Service: Oral Surgery;  Laterality: N/A;    Family History  Problem Relation Age of Onset  . Adopted: Yes    Allergies  Allergen Reactions  . Zocor [Simvastatin] Other (See Comments)    Patient states 'made him feel bad'    Current Outpatient Prescriptions on File Prior to Visit  Medication Sig Dispense Refill  . allopurinol (ZYLOPRIM) 300 MG tablet Take 300 mg by mouth daily.    Marland Kitchen atorvastatin (LIPITOR) 20 MG tablet Take 1 tablet (20 mg total) by  mouth daily. 30 tablet 0  . fluticasone (FLONASE) 50 MCG/ACT nasal spray Place 2 sprays into both nostrils daily. 16 g 1  . hydrochlorothiazide (HYDRODIURIL) 25 MG tablet Take 25 mg by mouth every morning.     . ranitidine (ZANTAC) 150 MG capsule Take 150 mg by mouth daily as needed for heartburn.     No current facility-administered medications on file prior to visit.     BP 128/79   Pulse 72   Temp 97.9 F (36.6 C) (Oral)   Resp 16   Ht 6\' 1"  (1.854 m)   Wt 240 lb 6.4 oz (109 kg)   SpO2 95%   BMI 31.72 kg/m      Objective:   Physical Exam  General Mental Status- Alert. General  Appearance- Not in acute distress.   Skin General: Color- Normal Color. Moisture- Normal Moisture.  Neck Carotid Arteries- Normal color. Moisture- Normal Moisture. No carotid bruits. No JVD.  Chest and Lung Exam Auscultation: Breath Sounds:-Normal.  Cardiovascular Auscultation:Rythm- Regular. Murmurs & Other Heart Sounds:Auscultation of the heart reveals- No Murmurs.  Abdomen Inspection:-Inspeection Normal. Palpation/Percussion:Note:No mass. Palpation and Percussion of the abdomen reveal- Non Tender, Non Distended + BS, no rebound or guarding.   Neurologic Cranial Nerve exam:- CN III-XII intact(No nystagmus), symmetric smile. Strength:- 5/5 equal and symmetric strength both upper and lower extremities.     Assessment & Plan:  Your blood pressure is good today. Continue on hctz.  For sleep apnea stay on cpap. I think you will get used to this.  For high cholesterol will get cmp and lipid panel today. Then may adjust dose of current medication.  Follow up date to determined after lab review. Likely 3-6 months. Or as needed  Shingrix vaccine printed for pt and I signed,  Mija Effertz, Percell Miller, [employer]

## 2016-09-30 NOTE — Patient Instructions (Addendum)
Your blood pressure is good today. Continue on hctz.  For sleep apnea stay on cpap. I think you will get used to this.  For high cholesterol will get cmp and lipid panel today. Then may adjust dose of current medication.  Follow up date to determined after lab review. Likely 3-6 months. Or as needed

## 2016-09-30 NOTE — Telephone Encounter (Signed)
Opened for review. 

## 2016-10-01 MED FILL — ALLOPURINOL 300 MG TAB: 300 | 30 days supply | Qty: 30 | Fill #0

## 2016-10-13 ENCOUNTER — Ambulatory Visit (INDEPENDENT_AMBULATORY_CARE_PROVIDER_SITE_OTHER): Payer: 59 | Admitting: Acute Care

## 2016-10-13 ENCOUNTER — Telehealth: Payer: Self-pay | Admitting: Pulmonary Disease

## 2016-10-13 ENCOUNTER — Encounter: Payer: Self-pay | Admitting: Acute Care

## 2016-10-13 DIAGNOSIS — G4733 Obstructive sleep apnea (adult) (pediatric): Secondary | ICD-10-CM

## 2016-10-13 DIAGNOSIS — Z9989 Dependence on other enabling machines and devices: Secondary | ICD-10-CM

## 2016-10-13 NOTE — Patient Instructions (Signed)
It is good to meet you today.  You are benefiting from CPAP therapy. Continue on CPAP at bedtime. You appear to be benefiting from the treatment Goal is to wear for at least 6 hours each night for maximal clinical benefit. Continue to work on weight loss, as the link between excess weight  and sleep apnea is well established.  Do not drive if sleepy. Remember to wash tubing, mask,reservoir and filter once weekly with soapy water. Follow up with NP in 3 months with down load.( [facility]) Please contact office for sooner follow up if symptoms do not improve or worsen or seek emergency care

## 2016-10-13 NOTE — Assessment & Plan Note (Signed)
Doing well on CPAP 100% compliant Benefiting from treatment AHI down to 2.5 Plan You are benefiting from CPAP therapy. Continue on CPAP at bedtime. You appear to be benefiting from the treatment Goal is to wear for at least 6 hours each night for maximal clinical benefit. Continue to work on weight loss, as the link between excess weight  and sleep apnea is well established.  Do not drive if sleepy. Remember to wash tubing, mask,reservoir and filter once weekly with soapy water. Follow up with NP in 3 months with down load.( [facility]) Please contact office for sooner follow up if symptoms do not improve or worsen or seek emergency care

## 2016-10-13 NOTE — Progress Notes (Signed)
History of Present Illness Brian Sawyer is a 61 y.o. male former smoker with OSA on CPAP therapy.He has been followed in the past by Dr. Corrie Sawyer,   10/13/2016 CPAP Follow Up: Pt. Presents for follow up after initiation of CPAP use.Pt was originally seen by Dr. Corrie Sawyer 08/04/2016. Sleep Study completed 08/23/2016 indicated Moderate to severe sleep apnea. CPAP was initiated 5-20 cm H2O. Pt. Presents today for follow up.  Pt. States he is doing well on CPAP. Pt. Is trying to get used to the machine. He did change the mask to nasal pillows 2 days ago. He feels this is a bette fir for him. He awakens less with the nasal pillows.He feels good. He states he does feel better rested.He denies fever, chest pain, orthopnea or hemoptysis.  Test Results: AHI per sleep study 09/03/16>> 21: Moderate to severe Sleep Apnea  Down Load  Auto Set 5-20 cm H2O 09/12/2016-10/11/2016 Usage 29/30 days (97%) > 4 hours 29 days ( 97%) Less than 4 hours 0 days Median usage 7 hours 29 minutes Median pressure 9.4 cmH2O AHI equals 2.5  CBC Latest Ref Rng & Units 06/10/2016 04/03/2014 03/11/2014  WBC 4.0 - 10.5 K/uL 7.3 6.2 7.0  Hemoglobin 13.0 - 17.0 g/dL 15.6 15.1 14.6  Hematocrit 39.0 - 52.0 % 46.6 44.3 43.9  Platelets 150.0 - 400.0 K/uL 263.0 236.0 253.0    BMP Latest Ref Rng & Units 09/30/2016 06/10/2016 04/03/2014  Glucose 70 - 99 mg/dL 118(H) 119(H) 113(H)  BUN 6 - 23 mg/dL 13 16 17   Creatinine 0.40 - 1.50 mg/dL 0.89 1.02 0.91  Sodium 135 - 145 mEq/L 138 139 137  Potassium 3.5 - 5.1 mEq/L 3.8 4.3 4.0  Chloride 96 - 112 mEq/L 103 100 102  CO2 19 - 32 mEq/L 28 28 29   Calcium 8.4 - 10.5 mg/dL 9.4 9.8 9.7      Past medical hx Past Medical History:  Diagnosis Date  . Difficult intubation    on 04/17/03 Brian Sawyer MDA rec: awake or asleep fiberoptic intubation, kidney surgery  . Diverticulosis   . GERD (gastroesophageal reflux disease)   . Gout   . History of kidney cancer 2006   left   . Hypertension   .  Kidney stones    right     Social History  Substance Use Topics  . Smoking status: Former Smoker    Years: 4.00    Types: Cigarettes    Quit date: 04/07/1976  . Smokeless tobacco: Never Used  . Alcohol use 0.0 oz/week     Comment: rare    Brian Sawyer reports that he quit smoking about 40 years ago. His smoking use included Cigarettes. He quit after 4.00 years of use. He has never used smokeless tobacco. He reports that he drinks alcohol. He reports that he does not use drugs.  Tobacco Cessation: Counseling given: Yes   Past surgical hx, Family hx, Social hx all reviewed.  Current Outpatient Prescriptions on File Prior to Visit  Medication Sig  . allopurinol (ZYLOPRIM) 300 MG tablet Take 300 mg by mouth daily.  Marland Kitchen atorvastatin (LIPITOR) 20 MG tablet Take 1 tablet (20 mg total) by mouth daily.  . fluticasone (FLONASE) 50 MCG/ACT nasal spray Place 2 sprays into both nostrils daily.  . hydrochlorothiazide (HYDRODIURIL) 25 MG tablet Take 25 mg by mouth every morning.   . ranitidine (ZANTAC) 150 MG capsule Take 150 mg by mouth daily as needed for heartburn.   No current facility-administered medications on  file prior to visit.      Allergies  Allergen Reactions  . Zocor [Simvastatin] Other (See Comments)    Patient states 'made him feel bad'    Review Of Systems:  Constitutional:   No  weight loss, night sweats,  Fevers, chills, fatigue, or  lassitude.  HEENT:   No headaches,  Difficulty swallowing,  Tooth/dental problems, or  Sore throat,                No sneezing, itching, ear ache, nasal congestion, post nasal drip,   CV:  No chest pain,  Orthopnea, PND, swelling in lower extremities, anasarca, dizziness, palpitations, syncope.   GI  No heartburn, indigestion, abdominal pain, nausea, vomiting, diarrhea, change in bowel habits, loss of appetite, bloody stools.   Resp: No shortness of breath with exertion or at rest.  No excess mucus, no productive cough,  No non-productive  cough,  No coughing up of blood.  No change in color of mucus.  No wheezing.  No chest wall deformity  Skin: no rash or lesions.  GU: no dysuria, change in color of urine, no urgency or frequency.  No flank pain, no hematuria   MS:  No joint pain or swelling.  No decreased range of motion.  No back pain.  Psych:  No change in mood or affect. No depression or anxiety.  No memory loss.   Vital Signs BP 122/74 (BP Location: Left Arm, Patient Position: Sitting, Cuff Size: Normal)   Pulse (!) 102   Ht 6\' 1"  (1.854 m)   Wt 240 lb (108.9 kg)   SpO2 94%   BMI 31.66 kg/m    Physical Exam:  General- No distress,  A&Ox3, pleasant ENT: No sinus tenderness, TM clear, pale nasal mucosa, no oral exudate,no post nasal drip, no LAN Cardiac: S1, S2, regular rate and rhythm, no murmur Chest: No wheeze/ rales/ dullness; no accessory muscle use, no nasal flaring, no sternal retractions Abd.: Soft Non-tender, obese Ext: No clubbing cyanosis, edema Neuro:  normal strength Skin: No rashes, warm and dry Psych: normal mood and behavior   Assessment/Plan  OSA on CPAP Doing well on CPAP 100% compliant Benefiting from treatment AHI down to 2.5 Plan You are benefiting from CPAP therapy. Continue on CPAP at bedtime. You appear to be benefiting from the treatment Goal is to wear for at least 6 hours each night for maximal clinical benefit. Continue to work on weight loss, as the link between excess weight  and sleep apnea is well established.  Do not drive if sleepy. Remember to wash tubing, mask,reservoir and filter once weekly with soapy water. Follow up with NP in 3 months with down load.( [place]) Please contact office for sooner follow up if symptoms do not improve or worsen or seek emergency care      Magdalen Spatz, NP 10/13/2016  2:25 PM

## 2016-10-13 NOTE — Telephone Encounter (Signed)
Called and spoke to pt. Pt has an appt today for his CPAP and states he is not enrolled in [place] and does not have an SD card. Called [employer] and had pt enrolled in [place] for todays appt. Pt verbalized understanding and denied any further questions or concerns at this time.

## 2016-10-14 ENCOUNTER — Other Ambulatory Visit: Payer: Self-pay | Admitting: Medical

## 2016-10-14 DIAGNOSIS — G4733 Obstructive sleep apnea (adult) (pediatric): Secondary | ICD-10-CM | POA: Diagnosis not present

## 2016-10-14 MED FILL — ATORVASTATIN 20 MG TABLET: 20 | 30 days supply | Qty: 30 | Fill #0

## 2016-10-14 NOTE — Telephone Encounter (Signed)
Rx sent to correct pharmacy. Pt notified.

## 2016-10-14 NOTE — Progress Notes (Signed)
I have reviewed and agree with assessment/plan.  Chesley Mires, MD [facility] Pulmonary/Critical Care 10/14/2016, 2:33 PM Pager:  [phone]

## 2016-10-14 NOTE — Telephone Encounter (Signed)
Pt called in to follow up on refill request. Pt would like to have further assistance.

## 2016-11-03 MED FILL — ALLOPURINOL 300 MG TAB: 300 | 30 days supply | Qty: 30 | Fill #1

## 2016-11-03 MED FILL — HYDROCHLOROTHIAZIDE 25 MG T: 25 | 30 days supply | Qty: 30 | Fill #1

## 2016-11-14 DIAGNOSIS — G4733 Obstructive sleep apnea (adult) (pediatric): Secondary | ICD-10-CM | POA: Diagnosis not present

## 2016-11-18 ENCOUNTER — Other Ambulatory Visit: Payer: Self-pay | Admitting: Medical

## 2016-11-18 ENCOUNTER — Telehealth: Payer: Self-pay | Admitting: Medical

## 2016-11-18 MED FILL — ATORVASTATIN 20 MG TABLET: 20 | 30 days supply | Qty: 30 | Fill #0

## 2016-11-18 NOTE — Telephone Encounter (Signed)
Caller name: Duel  Relation to pt: self  Call back number: [phone] Pharmacy: [place], [place]   Reason for call: Pt requesting refill on atorvastatin (LIPITOR) 20 MG tablet. Please advise.

## 2016-11-22 NOTE — Telephone Encounter (Signed)
Rx sent to pharmacy on 11/18/16

## 2016-12-06 MED FILL — HYDROCHLOROTHIAZIDE 25 MG T: 25 | 30 days supply | Qty: 30 | Fill #2

## 2016-12-06 MED FILL — ALLOPURINOL 300 MG TAB: 300 | 30 days supply | Qty: 30 | Fill #2

## 2016-12-14 DIAGNOSIS — G4733 Obstructive sleep apnea (adult) (pediatric): Secondary | ICD-10-CM | POA: Diagnosis not present

## 2016-12-15 DIAGNOSIS — C642 Malignant neoplasm of left kidney, except renal pelvis: Secondary | ICD-10-CM | POA: Diagnosis not present

## 2016-12-15 DIAGNOSIS — N200 Calculus of kidney: Secondary | ICD-10-CM | POA: Diagnosis not present

## 2016-12-15 DIAGNOSIS — Z125 Encounter for screening for malignant neoplasm of prostate: Secondary | ICD-10-CM | POA: Diagnosis not present

## 2016-12-22 ENCOUNTER — Other Ambulatory Visit: Payer: Self-pay | Admitting: Medical

## 2016-12-22 MED FILL — ATORVASTATIN 20 MG TABLET: 20 | 30 days supply | Qty: 30 | Fill #0

## 2016-12-22 NOTE — Telephone Encounter (Signed)
PT NEEDS this script LIPITOR atrovastatin 20 MG to be for 90 DAYS. Please correct to make 90 Day script.

## 2017-01-06 MED FILL — ALLOPURINOL 300 MG TAB: 300 | 30 days supply | Qty: 30 | Fill #3

## 2017-01-06 MED FILL — HYDROCHLOROTHIAZIDE 25 MG T: 25 | 30 days supply | Qty: 30 | Fill #0

## 2017-01-13 ENCOUNTER — Ambulatory Visit (INDEPENDENT_AMBULATORY_CARE_PROVIDER_SITE_OTHER): Payer: 59 | Admitting: Adult Health

## 2017-01-13 ENCOUNTER — Encounter: Payer: Self-pay | Admitting: Adult Health

## 2017-01-13 DIAGNOSIS — E669 Obesity, unspecified: Secondary | ICD-10-CM | POA: Diagnosis not present

## 2017-01-13 DIAGNOSIS — G4733 Obstructive sleep apnea (adult) (pediatric): Secondary | ICD-10-CM | POA: Diagnosis not present

## 2017-01-13 DIAGNOSIS — Z9989 Dependence on other enabling machines and devices: Secondary | ICD-10-CM

## 2017-01-13 NOTE — Assessment & Plan Note (Addendum)
Well controlled on CPAP   Plan  Patient Instructions  Keep up the good work Continue on CPAP at bedtime Work on [employer]. Do not drive if sleepy Follow-up in 1 year with Dr. Halford Chessman and As needed

## 2017-01-13 NOTE — Assessment & Plan Note (Signed)
Wt loss  

## 2017-01-13 NOTE — Progress Notes (Signed)
@Patient  ID: Brian Sawyer, male    DOB: 04-09-1955, 61 y.o.   MRN: [id number]  Chief Complaint  Patient presents with  . Follow-up    OSA    Referring provider: Elise Benne  HPI: 61 year old male followed for moderate sleep apnea   Ttest Results: AHI per sleep study 09/03/16>> 21: Moderate to severe Sleep Apnea  01/13/2017 Follow up : OSA  Patient returns for a 50-month follow-up.  Patient has known moderate sleep apnea.  He is on CPAP at bedtime.  Patient says he is trying to get used to his CPAP.  He is starting to feel better.  He feels more rested with less daytime sleepiness.  Download shows excellent compliance with average usage at 7.5 hours.  Patient is on AutoSet 5-20 cm H2O.  AHI 1.0.  Minimal leaks. Weight loss was discussed.    Allergies  Allergen Reactions  . Zocor [Simvastatin] Other (See Comments)    Patient states 'made him feel bad'    Immunization History  Administered Date(s) Administered  . Influenza Split 12/13/2016  . Influenza-Unspecified 11/17/2015  . Tdap 04/03/2014    Past Medical History:  Diagnosis Date  . Difficult intubation    on 04/17/03 [facility] MDA rec: awake or asleep fiberoptic intubation, kidney surgery  . Diverticulosis   . GERD (gastroesophageal reflux disease)   . Gout   . History of kidney cancer 2006   left   . Hypertension   . Kidney stones    right    Tobacco History: History  Smoking Status  . Former Smoker  . Years: 4.00  . Types: Cigarettes  . Quit date: 04/07/1976  Smokeless Tobacco  . Never Used   Counseling given: Not Answered   Outpatient Encounter Prescriptions as of 01/13/2017  Medication Sig  . allopurinol (ZYLOPRIM) 300 MG tablet Take 300 mg by mouth daily.  Marland Kitchen atorvastatin (LIPITOR) 20 MG tablet TAKE 1 TABLET BY MOUTH ONCE DAILY  . fluticasone (FLONASE) 50 MCG/ACT nasal spray Place 2 sprays into both nostrils daily.  . hydrochlorothiazide (HYDRODIURIL) 25 MG tablet Take 25 mg by mouth  every morning.   . ranitidine (ZANTAC) 150 MG capsule Take 150 mg by mouth daily as needed for heartburn.   No facility-administered encounter medications on file as of 01/13/2017.      Review of Systems  Constitutional:   No  weight loss, night sweats,  Fevers, chills, fatigue, or  lassitude.  HEENT:   No headaches,  Difficulty swallowing,  Tooth/dental problems, or  Sore throat,                No sneezing, itching, ear ache, nasal congestion, post nasal drip,   CV:  No chest pain,  Orthopnea, PND, swelling in lower extremities, anasarca, dizziness, palpitations, syncope.   GI  No heartburn, indigestion, abdominal pain, nausea, vomiting, diarrhea, change in bowel habits, loss of appetite, bloody stools.   Resp: No shortness of breath with exertion or at rest.  No excess mucus, no productive cough,  No non-productive cough,  No coughing up of blood.  No change in color of mucus.  No wheezing.  No chest wall deformity  Skin: no rash or lesions.  GU: no dysuria, change in color of urine, no urgency or frequency.  No flank pain, no hematuria   MS:  No joint pain or swelling.  No decreased range of motion.  No back pain.    Physical Exam  BP 134/76 (BP Location: Left Arm,  Cuff Size: Normal)   Pulse 92   Ht 6\' 3"  (1.905 m)   Wt 244 lb (110.7 kg)   SpO2 94%   BMI 30.50 kg/m   GEN: A/Ox3; pleasant , NAD, well nourished    HEENT:  [place]/AT,  EACs-clear, TMs-wnl, NOSE-clear, THROAT-clear, no lesions, no postnasal drip or exudate noted. Class 2-3 MP airway   NECK:  Supple w/ fair ROM; no JVD; normal carotid impulses w/o bruits; no thyromegaly or nodules palpated; no lymphadenopathy.    RESP  Clear  P & A; w/o, wheezes/ rales/ or rhonchi. no accessory muscle use, no dullness to percussion  CARD:  RRR, no m/r/g, no peripheral edema, pulses intact, no cyanosis or clubbing.  GI:   Soft & nt; nml bowel sounds; no organomegaly or masses detected.   Musco: Warm bil, no deformities or  joint swelling noted.   Neuro: alert, no focal deficits noted.    Skin: Warm, no lesions or rashes    Lab Results:  CBC    Component Value Date/Time   WBC 7.3 06/10/2016 0929   RBC 5.33 06/10/2016 0929   HGB 15.6 06/10/2016 0929   HCT 46.6 06/10/2016 0929   PLT 263.0 06/10/2016 0929   MCV 87.4 06/10/2016 0929   MCH 30.0 05/09/2013 0859   MCHC 33.5 06/10/2016 0929   RDW 14.8 06/10/2016 0929   LYMPHSABS 2.4 06/10/2016 0929   MONOABS 0.5 06/10/2016 0929   EOSABS 0.3 06/10/2016 0929   BASOSABS 0.1 06/10/2016 0929    BMET    Component Value Date/Time   NA 138 09/30/2016 0838   K 3.8 09/30/2016 0838   CL 103 09/30/2016 0838   CO2 28 09/30/2016 0838   GLUCOSE 118 (H) 09/30/2016 0838   BUN 13 09/30/2016 0838   CREATININE 0.89 09/30/2016 0838   CALCIUM 9.4 09/30/2016 0838   GFRNONAA >90 05/09/2013 0859   GFRAA >90 05/09/2013 0859    BNP No results found for: BNP  ProBNP No results found for: PROBNP  Imaging: No results found.   Assessment & Plan:   OSA on CPAP Well controlled on CPAP   Plan  Patient Instructions  Keep up the good work Continue on CPAP at bedtime Work on [employer]. Do not drive if sleepy Follow-up in 1 year with Dr. Halford Chessman and As needed       Obesity (BMI 30-39.9) Wt loss      Rexene Edison, NP 01/13/2017

## 2017-01-13 NOTE — Patient Instructions (Addendum)
Keep up the good work Continue on CPAP at bedtime Work on [employer]. Saline nasal spray 2 puffs twice daily Saline nasal gel as needed May use Flonase 1 puffEach nostril at nighttime Do not drive if sleepy Follow-up in 1 year with Dr. Halford Chessman and As needed

## 2017-01-13 NOTE — Progress Notes (Signed)
I have reviewed and agree with assessment/plan.  Chesley Mires, MD [facility] Pulmonary/Critical Care 01/13/2017, 2:55 PM Pager:  [phone]

## 2017-01-14 DIAGNOSIS — G4733 Obstructive sleep apnea (adult) (pediatric): Secondary | ICD-10-CM | POA: Diagnosis not present

## 2017-01-27 DIAGNOSIS — D1801 Hemangioma of skin and subcutaneous tissue: Secondary | ICD-10-CM | POA: Diagnosis not present

## 2017-01-27 DIAGNOSIS — L814 Other melanin hyperpigmentation: Secondary | ICD-10-CM | POA: Diagnosis not present

## 2017-01-27 DIAGNOSIS — Z85828 Personal history of other malignant neoplasm of skin: Secondary | ICD-10-CM | POA: Diagnosis not present

## 2017-01-27 MED FILL — ATORVASTATIN 20 MG TABLET: 20 | 30 days supply | Qty: 30 | Fill #1

## 2017-02-10 MED FILL — HYDROCHLOROTHIAZIDE 25 MG T: 25 | 30 days supply | Qty: 30 | Fill #1

## 2017-02-10 MED FILL — ALLOPURINOL 300 MG TAB: 300 | 30 days supply | Qty: 30 | Fill #4

## 2017-02-13 DIAGNOSIS — G4733 Obstructive sleep apnea (adult) (pediatric): Secondary | ICD-10-CM | POA: Diagnosis not present

## 2017-03-16 DIAGNOSIS — G4733 Obstructive sleep apnea (adult) (pediatric): Secondary | ICD-10-CM | POA: Diagnosis not present

## 2017-03-16 MED FILL — HYDROCHLOROTHIAZIDE 25 MG T: 25 | 30 days supply | Qty: 30 | Fill #2

## 2017-03-16 MED FILL — ALLOPURINOL 300 MG TAB: 300 | 30 days supply | Qty: 30 | Fill #5

## 2017-03-16 MED FILL — ATORVASTATIN 20 MG TABLET: 20 | 30 days supply | Qty: 30 | Fill #2

## 2017-04-13 DIAGNOSIS — J329 Chronic sinusitis, unspecified: Secondary | ICD-10-CM | POA: Diagnosis not present

## 2017-04-13 DIAGNOSIS — J320 Chronic maxillary sinusitis: Secondary | ICD-10-CM | POA: Diagnosis not present

## 2017-04-13 DIAGNOSIS — K090 Developmental odontogenic cysts: Secondary | ICD-10-CM | POA: Diagnosis not present

## 2017-04-13 DIAGNOSIS — J324 Chronic pansinusitis: Secondary | ICD-10-CM | POA: Diagnosis not present

## 2017-04-13 MED FILL — CLINDAMYCIN HCL 300 MG CAPS: 300 | 10 days supply | Qty: 30 | Fill #0

## 2017-04-16 DIAGNOSIS — G4733 Obstructive sleep apnea (adult) (pediatric): Secondary | ICD-10-CM | POA: Diagnosis not present

## 2017-04-19 MED FILL — ATORVASTATIN 20 MG TABLET: 20 | 30 days supply | Qty: 30 | Fill #3

## 2017-04-19 MED FILL — ALLOPURINOL 300 MG TAB: 300 | 30 days supply | Qty: 30 | Fill #6

## 2017-04-19 MED FILL — HYDROCHLOROTHIAZIDE 25 MG T: 25 | 30 days supply | Qty: 30 | Fill #3

## 2017-05-11 MED FILL — diazePAM 5 MG TABS: 5 | 2 days supply | Qty: 3 | Fill #0

## 2017-05-14 DIAGNOSIS — G4733 Obstructive sleep apnea (adult) (pediatric): Secondary | ICD-10-CM | POA: Diagnosis not present

## 2017-05-19 MED FILL — HYDROCHLOROTHIAZIDE 25 MG T: 25 | 30 days supply | Qty: 30 | Fill #4

## 2017-05-19 MED FILL — ALLOPURINOL 300 MG TAB: 300 | 30 days supply | Qty: 30 | Fill #7

## 2017-05-19 MED FILL — ATORVASTATIN 20 MG TABLET: 20 | 30 days supply | Qty: 30 | Fill #4

## 2017-06-07 ENCOUNTER — Encounter: Payer: Self-pay | Admitting: Gastroenterology

## 2017-06-14 DIAGNOSIS — G4733 Obstructive sleep apnea (adult) (pediatric): Secondary | ICD-10-CM | POA: Diagnosis not present

## 2017-06-20 MED FILL — ALLOPURINOL 300 MG TAB: 300 | 30 days supply | Qty: 30 | Fill #8

## 2017-06-20 MED FILL — ATORVASTATIN 20 MG TABLET: 20 | 30 days supply | Qty: 30 | Fill #5

## 2017-06-20 MED FILL — HYDROCHLOROTHIAZIDE 25 MG T: 25 | 30 days supply | Qty: 30 | Fill #5

## 2017-07-27 ENCOUNTER — Other Ambulatory Visit: Payer: Self-pay | Admitting: Medical

## 2017-07-27 DIAGNOSIS — H2513 Age-related nuclear cataract, bilateral: Secondary | ICD-10-CM | POA: Diagnosis not present

## 2017-07-27 MED FILL — ATORVASTATIN 20 MG TABLET: 20 | 30 days supply | Qty: 30 | Fill #0

## 2017-07-27 MED FILL — ALLOPURINOL 300 MG TABS: 300 | 30 days supply | Qty: 30 | Fill #0

## 2017-07-27 MED FILL — HYDROCHLOROTHIAZIDE 25 MG T: 25 | 30 days supply | Qty: 30 | Fill #6

## 2017-08-26 ENCOUNTER — Ambulatory Visit: Payer: 59 | Admitting: Medical

## 2017-08-26 ENCOUNTER — Encounter: Payer: Self-pay | Admitting: Medical

## 2017-08-26 VITALS — BP 133/78 | HR 80 | Temp 97.5°F | Resp 16 | Ht 75.0 in | Wt 245.4 lb

## 2017-08-26 DIAGNOSIS — J029 Acute pharyngitis, unspecified: Secondary | ICD-10-CM

## 2017-08-26 DIAGNOSIS — J0100 Acute maxillary sinusitis, unspecified: Secondary | ICD-10-CM

## 2017-08-26 DIAGNOSIS — J40 Bronchitis, not specified as acute or chronic: Secondary | ICD-10-CM | POA: Diagnosis not present

## 2017-08-26 MED ORDER — AMOXICILLIN-POT CLAVULANATE 875-125 MG PO TABS
1.0000 | ORAL_TABLET | Freq: Two times a day (BID) | ORAL | 0 refills | Status: DC
Start: 2017-08-26 — End: 2019-02-27

## 2017-08-26 MED ORDER — FLUTICASONE PROPIONATE 50 MCG/ACT NA SUSP: 2.0000 | Freq: Every day | NASAL | 1 refills | Status: AC

## 2017-08-26 MED ORDER — HYDROCODONE-HOMATROPINE 5-1.5 MG/5ML PO SYRP: 5.0000 mL | ORAL_SOLUTION | Freq: Three times a day (TID) | ORAL | 0 refills | Status: DC | PRN

## 2017-08-26 MED ORDER — CEFTRIAXONE SODIUM 1 G IJ SOLR
1.0000 g | Freq: Once | INTRAMUSCULAR | Status: AC
  Administered 2017-08-26: 1 g via INTRAMUSCULAR

## 2017-08-26 MED FILL — FLUTICASONE PROP 50 MCG SPR: 50 | 30 days supply | Qty: 16 | Fill #0

## 2017-08-26 MED FILL — HYDROCODONE-HOMATROPINE SOL: 5-1.5 | 7 days supply | Qty: 100 | Fill #0

## 2017-08-26 MED FILL — AMOX-CLAV 875-125 MG TABLET: 875-125 | 10 days supply | Qty: 20 | Fill #0

## 2017-08-26 NOTE — Patient Instructions (Addendum)
You appear to have bronchitis and sinusitis(more sinus infection). Rest hydrate and tylenol for fever. I am prescribing cough medicine hycodan, and augmentin antibiotic. For your nasal congestion you could use flonase the counter nasal steroid.   After discussion decided to give rocephin 1 gram IM.  You should gradually get better. If not then notify us and would recommend a chest xray.  Follow up in 7-10 days or as needed
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
## 2017-08-26 NOTE — Addendum Note (Signed)
Addended by: Hinton Dyer on: 08/26/2017 09:01 AM   Modules accepted: Orders

## 2017-08-31 MED FILL — HYDROCHLOROTHIAZIDE 25 MG T: 25 | 30 days supply | Qty: 30 | Fill #7

## 2017-08-31 MED FILL — ATORVASTATIN CALCIUM 20 MG: 20 | 30 days supply | Qty: 30 | Fill #1

## 2017-08-31 MED FILL — ALLOPURINOL 300 MG TABS: 300 | 30 days supply | Qty: 30 | Fill #1

## 2017-10-07 MED FILL — ATORVASTATIN CALCIUM 20 MG: 20 | 30 days supply | Qty: 30 | Fill #2

## 2017-10-07 MED FILL — HYDROCHLOROTHIAZIDE 25 MG T: 25 | 30 days supply | Qty: 30 | Fill #8

## 2017-10-07 MED FILL — ALLOPURINOL 300 MG TABS: 300 | 30 days supply | Qty: 30 | Fill #2

## 2017-11-21 MED FILL — ALLOPURINOL 300 MG TAB: 300 | 30 days supply | Qty: 30 | Fill #3

## 2017-11-21 MED FILL — ATORVASTATIN CALCIUM 20 MG: 20 | 30 days supply | Qty: 30 | Fill #3

## 2017-11-21 MED FILL — HYDROCHLOROTHIAZIDE 25 MG T: 25 | 30 days supply | Qty: 30 | Fill #9

## 2017-12-28 MED FILL — ATORVASTATIN CALCIUM 20 MG: 20 | 30 days supply | Qty: 30 | Fill #4

## 2017-12-29 MED FILL — ALLOPURINOL 300 MG TAB: 300 | 30 days supply | Qty: 30 | Fill #0

## 2017-12-29 MED FILL — HYDROCHLOROTHIAZIDE 25 MG T: 25 | 30 days supply | Qty: 30 | Fill #0

## 2018-02-01 MED FILL — HYDROCHLOROTHIAZIDE 25 MG T: 25 | 30 days supply | Qty: 30 | Fill #1

## 2018-02-01 MED FILL — ALLOPURINOL 300 MG TAB: 300 | 30 days supply | Qty: 30 | Fill #1

## 2018-03-02 DIAGNOSIS — D225 Melanocytic nevi of trunk: Secondary | ICD-10-CM | POA: Diagnosis not present

## 2018-03-02 DIAGNOSIS — D485 Neoplasm of uncertain behavior of skin: Secondary | ICD-10-CM | POA: Diagnosis not present

## 2018-03-02 DIAGNOSIS — E859 Amyloidosis, unspecified: Secondary | ICD-10-CM | POA: Diagnosis not present

## 2018-03-02 DIAGNOSIS — D2261 Melanocytic nevi of right upper limb, including shoulder: Secondary | ICD-10-CM | POA: Diagnosis not present

## 2018-03-02 DIAGNOSIS — Z85828 Personal history of other malignant neoplasm of skin: Secondary | ICD-10-CM | POA: Diagnosis not present

## 2018-03-10 ENCOUNTER — Telehealth: Payer: Self-pay | Admitting: *Deleted

## 2018-03-10 MED FILL — ALLOPURINOL 300 MG TABS: 300 | 30 days supply | Qty: 30 | Fill #2

## 2018-03-10 MED FILL — HYDROCHLOROTHIAZIDE 25 MG T: 25 | 30 days supply | Qty: 30 | Fill #2

## 2018-03-10 NOTE — Telephone Encounter (Signed)
Received Dermatopathology Report results from [facility]; forwarded to provider/SLS 12/27

## 2018-03-30 ENCOUNTER — Encounter: Payer: Self-pay | Admitting: Medical

## 2018-03-30 ENCOUNTER — Ambulatory Visit (HOSPITAL_BASED_OUTPATIENT_CLINIC_OR_DEPARTMENT_OTHER)
Admission: RE | Admit: 2018-03-30 | Discharge: 2018-03-30 | Disposition: A | Discharge status: Home / Self Care | Payer: 59 | Source: Ambulatory Visit | Attending: Medical | Admitting: Medical

## 2018-03-30 ENCOUNTER — Ambulatory Visit (INDEPENDENT_AMBULATORY_CARE_PROVIDER_SITE_OTHER): Payer: 59 | Admitting: Medical

## 2018-03-30 ENCOUNTER — Telehealth: Payer: Self-pay | Admitting: Medical

## 2018-03-30 VITALS — BP 130/78 | HR 66 | Temp 97.8°F | Resp 16 | Ht 75.0 in | Wt 244.0 lb

## 2018-03-30 DIAGNOSIS — M79631 Pain in right forearm: Secondary | ICD-10-CM | POA: Diagnosis not present

## 2018-03-30 DIAGNOSIS — G8929 Other chronic pain: Secondary | ICD-10-CM | POA: Insufficient documentation

## 2018-03-30 DIAGNOSIS — Z0000 Encounter for general adult medical examination without abnormal findings: Secondary | ICD-10-CM

## 2018-03-30 DIAGNOSIS — M25511 Pain in right shoulder: Secondary | ICD-10-CM | POA: Insufficient documentation

## 2018-03-30 DIAGNOSIS — Z125 Encounter for screening for malignant neoplasm of prostate: Secondary | ICD-10-CM | POA: Diagnosis not present

## 2018-03-30 LAB — COMPREHENSIVE METABOLIC PANEL
ALT: 24 U/L (ref 0–53)
AST: 40 U/L — ABNORMAL HIGH (ref 0–37)
Albumin: 5 g/dL (ref 3.5–5.2)
Alkaline Phosphatase: 104 U/L (ref 39–117)
BUN: 17 mg/dL (ref 6–23)
CO2: 29 meq/L (ref 19–32)
Calcium: 9.9 mg/dL (ref 8.4–10.5)
Chloride: 101 mEq/L (ref 96–112)
Creatinine, Ser: 0.96 mg/dL (ref 0.40–1.50)
GFR: 79.25 mL/min (ref >60.00)
Glucose, Bld: 103 mg/dL — ABNORMAL HIGH (ref 70–99)
Potassium: 4.2 mEq/L (ref 3.5–5.1)
Sodium: 139 mEq/L (ref 135–145)
Total Bilirubin: 0.9 mg/dL (ref 0.2–1.2)
Total Protein: 8 g/dL (ref 6.0–8.3)

## 2018-03-30 LAB — CBC WITH DIFFERENTIAL/PLATELET
BASOS PCT: 0.5 % (ref 0.0–3.0)
Basophils Absolute: 0 10*3/uL (ref 0.0–0.1)
EOS ABS: 0.2 10*3/uL (ref 0.0–0.7)
EOS PCT: 4.3 % (ref 0.0–5.0)
HCT: 44.7 % (ref 39.0–52.0)
Hemoglobin: 14.9 g/dL (ref 13.0–17.0)
Lymphocytes Relative: 41.9 % (ref 12.0–46.0)
Lymphs Abs: 2.4 10*3/uL (ref 0.7–4.0)
MCHC: 33.4 g/dL (ref 30.0–36.0)
MCV: 89.1 fl (ref 78.0–100.0)
MONO ABS: 0.4 10*3/uL (ref 0.1–1.0)
Monocytes Relative: 7.2 % (ref 3.0–12.0)
NEUTROS PCT: 46.1 % (ref 43.0–77.0)
Neutro Abs: 2.7 10*3/uL (ref 1.4–7.7)
Platelets: 226 10*3/uL (ref 150.0–400.0)
RBC: 5.02 Mil/uL (ref 4.22–5.81)
RDW: 15.1 % (ref 11.5–15.5)
WBC: 5.8 10*3/uL (ref 4.0–10.5)

## 2018-03-30 LAB — LIPID PANEL
Cholesterol: 256 mg/dL — ABNORMAL HIGH (ref 0–200)
HDL: 34 mg/dL — ABNORMAL LOW (ref >39.00)
NONHDL: 221.51
TRIGLYCERIDES: 286 mg/dL — AB (ref 0.0–149.0)
Total CHOL/HDL Ratio: 8
VLDL: 57.2 mg/dL — ABNORMAL HIGH (ref 0.0–40.0)

## 2018-03-30 LAB — POC URINALSYSI DIPSTICK (AUTOMATED)
BILIRUBIN UA: NEGATIVE
GLUCOSE UA: NEGATIVE
Ketones, UA: NEGATIVE
Leukocytes, UA: NEGATIVE
Nitrite, UA: NEGATIVE
Protein, UA: POSITIVE — AB
Spec Grav, UA: >=1.03 — AB (ref 1.010–1.025)
Urobilinogen, UA: NEGATIVE E.U./dL — AB
pH, UA: 6 (ref 5.0–8.0)

## 2018-03-30 LAB — PSA: PSA: 0.37 ng/mL (ref 0.10–4.00)

## 2018-03-30 LAB — LDL CHOLESTEROL, DIRECT: Direct LDL: 169 mg/dL

## 2018-03-30 MED ORDER — ROSUVASTATIN CALCIUM 20 MG PO TABS: 20.0000 mg | ORAL_TABLET | Freq: Every day | ORAL | 3 refills | Status: DC

## 2018-03-30 NOTE — Telephone Encounter (Signed)
Open to send Crestor prescription in place of atorvastatin.

## 2018-03-30 NOTE — Patient Instructions (Addendum)
For you wellness exam today I have ordered cbc, cmp, lipid panel, psa, and ua.  Vaccine up to date.  Recommend exercise and healthy diet.  We will let you know lab results as they come in.  Follow up date appointment will be determined after lab review.   For rt shoulder and forearm pain, I put in xray. Can use low dose ibuprofen for flares of pain. Since pain lingering will follow xrays and see how you are doing. May need to refer you to sports medicine MD.  Follow up date to be determined after lab and xray review.   Preventive Care 40-64 Years, Male Preventive care refers to lifestyle choices and visits with your health care provider that can promote health and wellness. What does preventive care include?   A yearly physical exam. This is also called an annual well check.  Dental exams once or twice a year.  Routine eye exams. Ask your health care provider how often you should have your eyes checked.  Personal lifestyle choices, including: ? Daily care of your teeth and gums. ? Regular physical activity. ? Eating a healthy diet. ? Avoiding tobacco and drug use. ? Limiting alcohol use. ? Practicing safe sex. ? Taking low-dose aspirin every day starting at age 14. What happens during an annual well check? The services and screenings done by your health care provider during your annual well check will depend on your age, overall health, lifestyle risk factors, and family history of disease. Counseling Your health care provider may ask you questions about your:  Alcohol use.  Tobacco use.  Drug use.  Emotional well-being.  Home and relationship well-being.  Sexual activity.  Eating habits.  Work and work Statistician. Screening You may have the following tests or measurements:  Height, weight, and BMI.  Blood pressure.  Lipid and cholesterol levels. These may be checked every 5 years, or more frequently if you are over 48 years old.  Skin check.  Lung  cancer screening. You may have this screening every year starting at age 33 if you have a 30-pack-year history of smoking and currently smoke or have quit within the past 15 years.  Colorectal cancer screening. All adults should have this screening starting at age 66 and continuing until age 50. Your health care provider may recommend screening at age 62. You will have tests every 1-10 years, depending on your results and the type of screening test. People at increased risk should start screening at an earlier age. Screening tests may include: ? Guaiac-based fecal occult blood testing. ? Fecal immunochemical test (FIT). ? Stool DNA test. ? Virtual colonoscopy. ? Sigmoidoscopy. During this test, a flexible tube with a tiny camera (sigmoidoscope) is used to examine your rectum and lower colon. The sigmoidoscope is inserted through your anus into your rectum and lower colon. ? Colonoscopy. During this test, a long, thin, flexible tube with a tiny camera (colonoscope) is used to examine your entire colon and rectum.  Prostate cancer screening. Recommendations will vary depending on your family history and other risks.  Hepatitis C blood test.  Hepatitis B blood test.  Sexually transmitted disease (STD) testing.  Diabetes screening. This is done by checking your blood sugar (glucose) after you have not eaten for a while (fasting). You may have this done every 1-3 years. Discuss your test results, treatment options, and if necessary, the need for more tests with your health care provider. Vaccines Your health care provider may recommend certain vaccines, such as:  Influenza vaccine. This is recommended every year.  Tetanus, diphtheria, and acellular pertussis (Tdap, Td) vaccine. You may need a Td booster every 10 years.  Varicella vaccine. You may need this if you have not been vaccinated.  Zoster vaccine. You may need this after age 60.  Measles, mumps, and rubella (MMR) vaccine. You may  need at least one dose of MMR if you were born in 1957 or later. You may also need a second dose.  Pneumococcal 13-valent conjugate (PCV13) vaccine. You may need this if you have certain conditions and have not been vaccinated.  Pneumococcal polysaccharide (PPSV23) vaccine. You may need one or two doses if you smoke cigarettes or if you have certain conditions.  Meningococcal vaccine. You may need this if you have certain conditions.  Hepatitis A vaccine. You may need this if you have certain conditions or if you travel or work in places where you may be exposed to hepatitis A.  Hepatitis B vaccine. You may need this if you have certain conditions or if you travel or work in places where you may be exposed to hepatitis B.  Haemophilus influenzae type b (Hib) vaccine. You may need this if you have certain risk factors. Talk to your health care provider about which screenings and vaccines you need and how often you need them. This information is not intended to replace advice given to you by your health care provider. Make sure you discuss any questions you have with your health care provider. Document Released: 03/28/2015 Document Revised: 04/21/2017 Document Reviewed: 12/31/2014 Elsevier Interactive Patient Education  2019 [employer].

## 2018-03-30 NOTE — Progress Notes (Signed)
Subjective:    Patient ID: Brian Sawyer, male    DOB: 12-08-1955, 63 y.o.   MRN: [id number]  HPI  Pt in for cpe.  Pt is fasting today. Pt has not been exercising. But states walking mile with hid dog daily. Moderate healthy diet.  Pt has not had flu vaccine.  Pt states he is overdue for colonoscopy. He will call gi Dr. Ardis Hughs office.  Pt has been checking his bp. Most of time when he checks bp is less than 140/90  Review of Systems  Constitutional: Negative for chills, diaphoresis, fatigue and unexpected weight change.  HENT: Negative for congestion, dental problem, ear pain and facial swelling.   Eyes: Negative for pain and itching.  Respiratory: Negative for cough, chest tightness, shortness of breath and wheezing.   Cardiovascular: Negative for chest pain and palpitations.  Gastrointestinal: Negative for abdominal distention, blood in stool, constipation and diarrhea.  Genitourinary: Negative for difficulty urinating, enuresis, flank pain, frequency and genital sores.  Musculoskeletal:       Years ago hurt his left shoulder playing basketball. But that went away.  Pt does note rt shoulder has been hurting around thanksgiving. He noticed he had pain even throwing football. He states it took him about 6 weeks to get better. Pain was on and off. At one point he pointed to area of pain rt upper pectoralis and upper are area.     Skin: Negative for rash.  Neurological: Negative for dizziness, seizures, numbness and headaches.  Hematological: Negative for adenopathy. Does not bruise/bleed easily.  Psychiatric/Behavioral: Negative for agitation, behavioral problems, decreased concentration, dysphoric mood, self-injury, sleep disturbance and suicidal ideas.   Past Medical History:  Diagnosis Date  . Difficult intubation    on 04/17/03 [facility] MDA rec: awake or asleep fiberoptic intubation, kidney surgery  . Diverticulosis   . GERD (gastroesophageal reflux disease)   . Gout     . History of kidney cancer 2006   left   . Hypertension   . Kidney stones    right     Social History   Socioeconomic History  . Marital status: Married    Spouse name: Not on file  . Number of children: Not on file  . Years of education: Not on file  . Highest education level: Not on file  Occupational History  . Not on file  Social Needs  . Financial resource strain: Not on file  . Food insecurity:    Worry: Not on file    Inability: Not on file  . Transportation needs:    Medical: Not on file    Non-medical: Not on file  Tobacco Use  . Smoking status: Former Smoker    Years: 4.00    Types: Cigarettes    Last attempt to quit: 04/07/1976    Years since quitting: 42.0  . Smokeless tobacco: Never Used  Substance and Sexual Activity  . Alcohol use: Yes    Alcohol/week: 0.0 standard drinks    Comment: rare  . Drug use: No  . Sexual activity: Not on file  Lifestyle  . Physical activity:    Days per week: Not on file    Minutes per session: Not on file  . Stress: Not on file  Relationships  . Social connections:    Talks on phone: Not on file    Gets together: Not on file    Attends religious service: Not on file    Active member of club or organization: Not  on file    Attends meetings of clubs or organizations: Not on file    Relationship status: Not on file  . Intimate partner violence:    Fear of current or ex partner: Not on file    Emotionally abused: Not on file    Physically abused: Not on file    Forced sexual activity: Not on file  Other Topics Concern  . Not on file  Social History Narrative  . Not on file    Past Surgical History:  Procedure Laterality Date  . COLONOSCOPY WITH PROPOFOL N/A 07/04/2014   Procedure: COLONOSCOPY WITH PROPOFOL;  Surgeon: Milus Banister, MD;  Location: [facility];  Service: Endoscopy;  Laterality: N/A;  . 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 EXCISION OF ORAL TUMOR    . PARTIAL NEPHRECTOMY Left 2006  . SURGERY SCROTAL / TESTICULAR  1980s  . TOOTH EXTRACTION N/A 05/14/2013   Procedure: REMOVAL OF SUPERNUMERARY TOOTH NUMBER FIFTY-NINE WITH BONE GRAFTING;  Surgeon: Isac Caddy, DDS;  Location: [place];  Service: Oral Surgery;  Laterality: N/A;    Family History  Adopted: Yes    Allergies  Allergen Reactions  . Zocor [Simvastatin] Other (See Comments)    Patient states 'made him feel bad'    Current Outpatient Medications on File Prior to Visit  Medication Sig Dispense Refill  . allopurinol (ZYLOPRIM) 300 MG tablet Take 300 mg by mouth daily.    Marland Kitchen amoxicillin-clavulanate (AUGMENTIN) 875-125 MG tablet Take 1 tablet by mouth 2 (two) times daily. 20 tablet 0  . atorvastatin (LIPITOR) 20 MG tablet TAKE 1 TABLET BY MOUTH ONCE DAILY 90 tablet 1  . fluticasone (FLONASE) 50 MCG/ACT nasal spray Place 2 sprays into both nostrils daily. 16 g 1  . hydrochlorothiazide (HYDRODIURIL) 25 MG tablet Take 25 mg by mouth every morning.     Marland Kitchen HYDROcodone-homatropine (HYCODAN) 5-1.5 MG/5ML syrup Take 5 mLs by mouth every 8 (eight) hours as needed for cough. 100 mL 0  . ranitidine (ZANTAC) 150 MG capsule Take 150 mg by mouth daily as needed for heartburn.     No current facility-administered medications on file prior to visit.     BP 140/75   Pulse 66   Temp 97.8 F (36.6 C) (Oral)   Resp 16   Ht 6\' 3"  (1.905 m)   Wt 244 lb (110.7 kg)   SpO2 98%   BMI 30.50 kg/m       Objective:   Physical Exam  General  Mental Status - Alert. General Appearance - Well groomed. Not in acute distress.  Skin Rashes- No Rashes.  HEENT Head- Normal. Ear Auditory Canal - Left- Normal. Right - Normal.Tympanic Membrane- Left- Normal. Right- Normal. Eye Sclera/Conjunctiva- Left- Normal. Right- Normal. Nose & Sinuses Nasal  Mucosa- Left-  Boggy and Congested. Right-  Boggy and  Congested.Bilateral maxillary and frontal sinus pressure. Mouth & Throat Lips: Upper Lip- Normal: no dryness, cracking, pallor, cyanosis, or vesicular eruption. Lower Lip-Normal: no dryness, cracking, pallor, cyanosis or vesicular eruption. Buccal Mucosa- Bilateral- No Aphthous ulcers. Oropharynx- No Discharge or Erythema. Tonsils: Characteristics- Bilateral- No Erythema or Congestion. Size/Enlargement- Bilateral- No enlargement. Discharge- bilateral-None.  Neck Neck- Supple. No Masses.   Chest and Lung Exam Auscultation: Breath Sounds:-Clear even and unlabored.  Cardiovascular Auscultation:Rythm- Regular, rate and rhythm.  Murmurs & Other Heart Sounds:Ausculatation of the heart reveal- No Murmurs.  Lymphatic Head & Neck General Head & Neck Lymphatics: Bilateral: Description- No Localized lymphadenopathy.   Neurologic Cranial Nerve exam:- CN III-XII intact(No nystagmus), symmetric smile. Strength:- 5/5 equal and symmetric strength both upper and lower extremities.   Genital  exam- no hernia on exam. No testicle pain. Rectal- smooth prostate. Heme negative stool test.   Derm- small scattered moles(pt followed by derm)  Rt shoulder- good rom. No crepitus. Possible atrophy of muscle posterior shoulder area. Rt pec- faint pain wear pectoralis tendon inserts toward shoulder.  Rt forearm-mild tender to palpation.  Rt elbow-no tenderness to palpatin.     Assessment & Plan:  For you wellness exam today I have ordered cbc, cmp, lipid panel, psa, and ua.  Vaccine up to date.  Recommend exercise and healthy diet.  We will let you know lab results as they come in.  Follow up date appointment will be determined after lab review.   For rt shoulder and forearm pain, I put in xray. Can use low dose ibuprofen for flares of pain. Since pain lingering will follow xrays and see how you are doing. May need to refer you to sports  medicine MD.  Follow up date to be determined after lab and xray review.  [name], PA-C

## 2018-03-31 MED FILL — ROSUVASTATIN CALCIUM 20 MG: 20 | 30 days supply | Qty: 30 | Fill #0

## 2018-04-10 MED FILL — HYDROCHLOROTHIAZIDE 25 MG T: 25 | 30 days supply | Qty: 30 | Fill #3

## 2018-04-10 MED FILL — ALLOPURINOL 300 MG TABS: 300 | 30 days supply | Qty: 30 | Fill #3

## 2018-05-03 MED FILL — ROSUVASTATIN CALCIUM 20 MG: 20 | 30 days supply | Qty: 30 | Fill #0

## 2018-05-15 MED FILL — HYDROCHLOROTHIAZIDE 25 MG T: 25 | 30 days supply | Qty: 30 | Fill #4

## 2018-05-15 MED FILL — ALLOPURINOL 300 MG TABS: 300 | 30 days supply | Qty: 30 | Fill #4

## 2018-05-16 DIAGNOSIS — G4733 Obstructive sleep apnea (adult) (pediatric): Secondary | ICD-10-CM | POA: Diagnosis not present

## 2018-05-26 MED FILL — FLUTICASONE PROP 50 MCG SPR: 50 | 30 days supply | Qty: 16 | Fill #0

## 2018-05-30 MED FILL — ROSUVASTATIN CALCIUM 20 MG: 20 | 30 days supply | Qty: 30 | Fill #1

## 2018-06-17 MED FILL — ALLOPURINOL 300 MG TABS: 300 | 30 days supply | Qty: 30 | Fill #5

## 2018-06-17 MED FILL — HYDROCHLOROTHIAZIDE 25 MG T: 25 | 30 days supply | Qty: 30 | Fill #5

## 2018-07-06 MED FILL — ROSUVASTATIN CALCIUM 20 MG: 20 | 30 days supply | Qty: 30 | Fill #2

## 2018-07-20 MED FILL — ALLOPURINOL 300 MG TAB: 300 | 30 days supply | Qty: 30 | Fill #6

## 2018-07-20 MED FILL — HYDROCHLOROTHIAZIDE 25 MG T: 25 | 30 days supply | Qty: 30 | Fill #6

## 2018-08-10 ENCOUNTER — Other Ambulatory Visit: Payer: Self-pay | Admitting: Medical

## 2018-08-10 ENCOUNTER — Other Ambulatory Visit: Payer: Self-pay

## 2018-08-10 ENCOUNTER — Ambulatory Visit (INDEPENDENT_AMBULATORY_CARE_PROVIDER_SITE_OTHER): Payer: 59 | Admitting: Medical

## 2018-08-10 ENCOUNTER — Encounter: Payer: Self-pay | Admitting: Medical

## 2018-08-10 ENCOUNTER — Other Ambulatory Visit: Payer: Self-pay | Admitting: *Deleted

## 2018-08-10 VITALS — BP 133/80 | HR 76 | Ht 75.0 in | Wt 240.0 lb

## 2018-08-10 DIAGNOSIS — E785 Hyperlipidemia, unspecified: Secondary | ICD-10-CM

## 2018-08-10 DIAGNOSIS — R739 Hyperglycemia, unspecified: Secondary | ICD-10-CM | POA: Diagnosis not present

## 2018-08-10 DIAGNOSIS — I10 Essential (primary) hypertension: Secondary | ICD-10-CM | POA: Diagnosis not present

## 2018-08-10 MED FILL — ROSUVASTATIN CALCIUM 20 MG: 20 | 30 days supply | Qty: 30 | Fill #0

## 2018-08-10 MED FILL — ALLOPURINOL 300 MG TAB: 300 | 30 days supply | Qty: 30 | Fill #7

## 2018-08-10 MED FILL — HYDROCHLOROTHIAZIDE 25 MG T: 25 | 30 days supply | Qty: 30 | Fill #7

## 2018-08-10 NOTE — Telephone Encounter (Signed)
Patient needs this sent before 4 pm because he is going out of town and will be out of town for 4-5 days and he is completely out. Please re-send. Brian Sawyer never received this when it was sent this am.

## 2018-08-10 NOTE — Addendum Note (Signed)
Addended by: Kelle Darting A on: 08/10/2018 09:25 AM   Modules accepted: Orders

## 2018-08-10 NOTE — Progress Notes (Signed)
Subjective:    Patient ID: Brian Sawyer, male    DOB: [birth year], 63 y.o.   MRN: [id number]  HPI  Virtual Visit via Video Note  I connected with Brian Sawyer on 08/10/18 at  8:20 AM EDT by a video enabled telemedicine application and verified that I am speaking with the correct person using two identifiers.  Location: Patient: home Provider: office  Visit virtual due to viral pandemic.   I discussed the limitations of evaluation and management by telemedicine and the availability of in person appointments. The patient expressed understanding and agreed to proceed.   History of Present Illness:  Pt has follow up visit today.  Pt admits has not been exercising much. He admits not real strict with his diet. Pt has high cholesterol and is on crestor.  Pt bp is well controlled. No cardiac or neurologic signs or symptoms.  Hx of gout but no reported gout flares  No recent reflux symptoms. Only rare tums if has symptoms.    Observations/Objective:  General-no acute distress, pleasant, oriented. Lungs- on inspection lungs appear unlabored. Neck- no tracheal deviation or jvd on inspection. Neuro- gross motor function appears intact.   Assessment and Plan: Your blood pressure controlled today. Continue current meds.  For gout hx continue on allopurinol.  For high cholesterol continue crestor and check lipid panel and cmp.  For high sugar in past will get a1c.  Follow up date to be determined after lab review.  Brian Pai, PA-C  Follow Up Instructions:    I discussed the assessment and treatment plan with the patient. The patient was provided an opportunity to ask questions and all were answered. The patient agreed with the plan and demonstrated an understanding of the instructions.   The patient was advised to call back or seek an in-person evaluation if the symptoms worsen or if the condition fails to improve as anticipated.  25 minutes spent with pt.  50% of time counseling on plan going forward   [name], PA-C    Review of Systems  Constitutional: Negative for activity change, chills, diaphoresis and fever.  Respiratory: Negative for cough, chest tightness and shortness of breath.   Cardiovascular: Negative for chest pain, palpitations and leg swelling.  Gastrointestinal: Negative for abdominal pain, nausea and vomiting.  Musculoskeletal: Negative for neck pain and neck stiffness.  Neurological: Negative for dizziness, seizures, syncope, weakness and headaches.  Psychiatric/Behavioral: Negative for agitation, behavioral problems, confusion and sleep disturbance. The patient is not nervous/anxious.    Past Medical History:  Diagnosis Date  . Difficult intubation    on 04/17/03 [facility] MDA rec: awake or asleep fiberoptic intubation, kidney surgery  . Diverticulosis   . GERD (gastroesophageal reflux disease)   . Gout   . History of kidney cancer 2006   left   . Hypertension   . Kidney stones    right     Social History   Socioeconomic History  . Marital status: Married    Spouse name: Not on file  . Number of children: Not on file  . Years of education: Not on file  . Highest education level: Not on file  Occupational History  . Not on file  Social Needs  . Financial resource strain: Not on file  . Food insecurity:    Worry: Not on file    Inability: Not on file  . Transportation needs:    Medical: Not on file    Non-medical: Not on file  Tobacco  Use  . Smoking status: Former Smoker    Years: 4.00    Types: Cigarettes    Last attempt to quit: 04/07/1976    Years since quitting: 42.3  . Smokeless tobacco: Never Used  Substance and Sexual Activity  . Alcohol use: Yes    Alcohol/week: 0.0 standard drinks    Comment: rare  . Drug use: No  . Sexual activity: Not on file  Lifestyle  . Physical activity:    Days per week: Not on file    Minutes per session: Not on file  . Stress: Not on file   Relationships  . Social connections:    Talks on phone: Not on file    Gets together: Not on file    Attends religious service: Not on file    Active member of club or organization: Not on file    Attends meetings of clubs or organizations: Not on file    Relationship status: Not on file  . Intimate partner violence:    Fear of current or ex partner: Not on file    Emotionally abused: Not on file    Physically abused: Not on file    Forced sexual activity: Not on file  Other Topics Concern  . Not on file  Social History Narrative  . Not on file    Past Surgical History:  Procedure Laterality Date  . COLONOSCOPY WITH PROPOFOL N/A 07/04/2014   Procedure: COLONOSCOPY WITH PROPOFOL;  Surgeon: Milus Banister, MD;  Location: [facility];  Service: Endoscopy;  Laterality: N/A;  . EAR CYST EXCISION N/A 05/14/2013   Procedure: 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;  Surgeon: Isac Caddy, DDS;  Location: [place];  Service: Oral Surgery;  Laterality: N/A;  . EXCISION OF ORAL TUMOR    . PARTIAL NEPHRECTOMY Left 2006  . SURGERY SCROTAL / TESTICULAR  1980s  . TOOTH EXTRACTION N/A 05/14/2013   Procedure: REMOVAL OF SUPERNUMERARY TOOTH NUMBER FIFTY-NINE WITH BONE GRAFTING;  Surgeon: Isac Caddy, DDS;  Location: [place];  Service: Oral Surgery;  Laterality: N/A;    Family History  Adopted: Yes    Allergies  Allergen Reactions  . Zocor [Simvastatin] Other (See Comments)    Patient states 'made him feel bad'    Current Outpatient Medications on File Prior to Visit  Medication Sig Dispense Refill  . allopurinol (ZYLOPRIM) 300 MG tablet Take 300 mg by mouth daily.    Marland Kitchen amoxicillin-clavulanate (AUGMENTIN) 875-125 MG tablet Take 1 tablet by mouth 2 (two) times daily. 20 tablet 0  . fluticasone (FLONASE) 50 MCG/ACT nasal spray Place 2 sprays into both nostrils daily. 16 g 1  . hydrochlorothiazide (HYDRODIURIL) 25 MG tablet Take 25 mg by mouth  every morning.     Marland Kitchen HYDROcodone-homatropine (HYCODAN) 5-1.5 MG/5ML syrup Take 5 mLs by mouth every 8 (eight) hours as needed for cough. 100 mL 0  . ranitidine (ZANTAC) 150 MG capsule Take 150 mg by mouth daily as needed for heartburn.    . rosuvastatin (CRESTOR) 20 MG tablet TAKE 1 TABLET (20 MG TOTAL) BY MOUTH DAILY. 30 tablet 2   No current facility-administered medications on file prior to visit.     BP 133/80   Pulse 76   Ht 6\' 3"  (1.905 m)   Wt 240 lb (108.9 kg)   BMI 30.00 kg/m       Objective:   Physical Exam        Assessment & Plan:

## 2018-08-10 NOTE — Addendum Note (Signed)
Addended by: Kelle Darting A on: 08/10/2018 09:29 AM   Modules accepted: Orders

## 2018-08-10 NOTE — Patient Instructions (Addendum)
Your blood pressure controlled today. Continue current meds.  For gout hx continue on allopurinol.  For high cholesterol continue crestor and check lipid panel and cmp.  For high sugar in past will get a1c.  Follow up date to be determined after lab review.

## 2018-08-16 ENCOUNTER — Other Ambulatory Visit: Payer: 59

## 2018-08-31 ENCOUNTER — Telehealth: Payer: Self-pay

## 2018-08-31 ENCOUNTER — Encounter: Payer: Self-pay | Admitting: Medical

## 2018-08-31 NOTE — Telephone Encounter (Signed)
Mychart message sent to Pt w/ PCP recommendations.

## 2018-08-31 NOTE — Telephone Encounter (Signed)
Copied from [place] [phone]. Topic: General - Call Back - No Documentation >> Aug 31, 2018  1:38 PM Erick Blinks wrote: Reason for CRM: Pt wants to know his blood type. Requesting lab orders for Monday. Please advise

## 2018-08-31 NOTE — Telephone Encounter (Signed)
I am not sure what to tell him I have never ordered type unless pt needs typing for transfusion. Apart from this reason insurance may not pay for. I would recommend he  donate blood through oganization called [facility]. My wife does and she has account on with them that tells her blood type. I would have him call them and ask about this. Number is [phone].  Otherwise I would have to get lab to investigate codes and cost in event not covered. Donation is free and he can get type. Just make sure he calls first and gets clarification  He probably wants to know as theory behind blood type and protection and susceptibility to covid.

## 2018-09-04 ENCOUNTER — Other Ambulatory Visit: Payer: Self-pay

## 2018-09-04 ENCOUNTER — Other Ambulatory Visit (INDEPENDENT_AMBULATORY_CARE_PROVIDER_SITE_OTHER): Payer: 59

## 2018-09-04 DIAGNOSIS — E785 Hyperlipidemia, unspecified: Secondary | ICD-10-CM

## 2018-09-04 DIAGNOSIS — I10 Essential (primary) hypertension: Secondary | ICD-10-CM | POA: Diagnosis not present

## 2018-09-04 DIAGNOSIS — R739 Hyperglycemia, unspecified: Secondary | ICD-10-CM | POA: Diagnosis not present

## 2018-09-04 LAB — LIPID PANEL
Cholesterol: 135 mg/dL (ref 0–200)
HDL: 33.6 mg/dL — ABNORMAL LOW (ref >39.00)
LDL Cholesterol: 73 mg/dL (ref 0–99)
NonHDL: 101
Total CHOL/HDL Ratio: 4
Triglycerides: 142 mg/dL (ref 0.0–149.0)
VLDL: 28.4 mg/dL (ref 0.0–40.0)

## 2018-09-04 LAB — COMPREHENSIVE METABOLIC PANEL
ALT: 29 U/L (ref 0–53)
AST: 43 U/L — ABNORMAL HIGH (ref 0–37)
Albumin: 4.7 g/dL (ref 3.5–5.2)
Alkaline Phosphatase: 115 U/L (ref 39–117)
BUN: 17 mg/dL (ref 6–23)
CO2: 29 mEq/L (ref 19–32)
Calcium: 9.5 mg/dL (ref 8.4–10.5)
Chloride: 103 mEq/L (ref 96–112)
Creatinine, Ser: 0.87 mg/dL (ref 0.40–1.50)
GFR: 88.66 mL/min (ref >60.00)
Glucose, Bld: 123 mg/dL — ABNORMAL HIGH (ref 70–99)
Potassium: 4.3 mEq/L (ref 3.5–5.1)
Sodium: 141 mEq/L (ref 135–145)
Total Bilirubin: 0.7 mg/dL (ref 0.2–1.2)
Total Protein: 7.3 g/dL (ref 6.0–8.3)

## 2018-09-04 LAB — HEMOGLOBIN A1C: Hgb A1c MFr Bld: 6.9 % — ABNORMAL HIGH (ref 4.6–6.5)

## 2018-09-05 ENCOUNTER — Telehealth: Payer: Self-pay | Admitting: Medical

## 2018-09-05 DIAGNOSIS — E119 Type 2 diabetes mellitus without complications: Secondary | ICD-10-CM

## 2018-09-05 MED ORDER — METFORMIN HCL 500 MG PO TABS: 500.0000 mg | ORAL_TABLET | Freq: Two times a day (BID) | ORAL | 3 refills | Status: DC

## 2018-09-05 NOTE — Telephone Encounter (Signed)
Rx metformin and diabetic education referral.

## 2018-09-11 MED FILL — ROSUVASTATIN CALCIUM 20 MG: 20 | 30 days supply | Qty: 30 | Fill #1

## 2018-09-19 MED FILL — ALLOPURINOL 300 MG TAB: 300 | 30 days supply | Qty: 30 | Fill #8

## 2018-09-19 MED FILL — HYDROCHLOROTHIAZIDE 25 MG T: 25 | 30 days supply | Qty: 30 | Fill #8

## 2018-10-03 ENCOUNTER — Encounter: Payer: 59 | Attending: Medical | Admitting: *Deleted

## 2018-10-03 ENCOUNTER — Other Ambulatory Visit: Payer: Self-pay

## 2018-10-03 DIAGNOSIS — E119 Type 2 diabetes mellitus without complications: Secondary | ICD-10-CM | POA: Insufficient documentation

## 2018-10-03 MED FILL — metFORMIN HCL 500 MG TABS: 500 | 30 days supply | Qty: 60 | Fill #0

## 2018-10-03 NOTE — Patient Instructions (Signed)
Plan:  Aim for 4 Carb Choices per meal (60 grams) +/- 1 either way  Aim for 0-2 Carbs per snack if hungry  Include lean protein in moderation with your meals and snacks Consider  increasing your activity level by walking or other activities daily as tolerated Consider the idea of getting a meter and checking BG at alternate times per day on different days of the week  Continue taking medication as directed by MD

## 2018-10-05 NOTE — Progress Notes (Signed)
Diabetes Self-Management Education  Visit Type: First/Initial  Appt. Start Time: 1530 Appt. End Time: 1700  10/05/2018  [name], identified by name and date of birth, is a 63 y.o. male with a diagnosis of Diabetes: Type 2. Patient is newly diagnosed, works as Engineer, manufacturing systems so can be physically active during his day or sit at computer, it varies. Historically he has enjoyed being active in sports like basketball, softball and golf. With shoulder pain, he doesn't do that as much anymore. He typcally prepares the evening meal at home.   ASSESSMENT  There were no vitals taken for this visit. There is no height or weight on file to calculate BMI.  Diabetes Self-Management Education - 10/03/18 1538      Visit Information   Visit Type  First/Initial      Initial Visit   Diabetes Type  Type 2    Are you currently following a meal plan?  No    Are you taking your medications as prescribed?  Yes    Date Diagnosed  09/04/2018      Health Coping   How would you rate your overall health?  Good      Psychosocial Assessment   Patient Belief/Attitude about Diabetes  Motivated to manage diabetes    Self-care barriers  None    Other persons present  Patient    Patient Concerns  Nutrition/Meal planning;Glycemic Control    Special Needs  None    Preferred Learning Style  No preference indicated    Learning Readiness  Ready    How often do you need to have someone help you when you read instructions, pamphlets, or other written materials from your doctor or pharmacy?  1 - Never    What is the last grade level you completed in school?  BA degree      Pre-Education Assessment   Patient understands the diabetes disease and treatment process.  Needs Instruction    Patient understands incorporating nutritional management into lifestyle.  Needs Instruction    Patient undertands incorporating physical activity into lifestyle.  Needs Instruction    Patient understands using  medications safely.  Needs Instruction    Patient understands monitoring blood glucose, interpreting and using results  Needs Instruction    Patient understands prevention, detection, and treatment of acute complications.  Needs Instruction    Patient understands prevention, detection, and treatment of chronic complications.  Needs Instruction    Patient understands how to develop strategies to address psychosocial issues.  Needs Instruction    Patient understands how to develop strategies to promote health/change behavior.  Needs Instruction      Complications   Last HgB A1C per patient/outside source  6.9 %    How often do you check your blood sugar?  0 times/day (not testing)    Have you had a dilated eye exam in the past 12 months?  Yes    Have you had a dental exam in the past 12 months?  Yes    Are you checking your feet?  Yes    How many days per week are you checking your feet?  3      Dietary Intake   Breakfast  Cheerios usually mixed honey nut with regular and 2% milk OR bagel with cream cheese OR biscuit with chicken (less often lately)    Lunch  eats out when going in to work OR if at home will have sandwich OR left overs    Snack (  afternoon)  has stopped the candy bars or chips, now eating more nuts or fresh fruit OR dill pickles    Dinner  around 6 PM: typically fresh foods, occasionally a convenience food including meat or chicken, starch like pasta or rice, vegetables or salad    Beverage(s)  coffee with creamer, small glass juice, water (no soda), occasionally lemonade      Exercise   Exercise Type  Light (walking / raking leaves)   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  3    How many minutes per day do you exercise?  45    Total minutes per week of exercise  135      Patient Education   Previous Diabetes Education  No    Disease state   Factors that contribute to the development of diabetes    Nutrition management    Role of diet in the treatment of diabetes and the relationship between the three main macronutrients and blood glucose level;Food label reading, portion sizes and measuring food.;Carbohydrate counting    Physical activity and exercise   Role of exercise on diabetes management, blood pressure control and cardiac health.    Medications  Reviewed patients medication for diabetes, action, purpose, timing of dose and side effects.    Chronic complications  Relationship between chronic complications and blood glucose control    Psychosocial adjustment  Role of stress on diabetes    Personal strategies to promote health  Lifestyle issues that need to be addressed for better diabetes care      Individualized Goals (developed by patient)   Nutrition  Follow meal plan discussed    Physical Activity  Exercise 3-5 times per week    Medications  take my medication as prescribed    Monitoring   Other (comment)   consider getting a meter so you can test periodically and know where you stand between MD visits     Post-Education Assessment   Patient understands the diabetes disease and treatment process.  Demonstrates understanding / competency    Patient understands incorporating nutritional management into lifestyle.  Demonstrates understanding / competency    Patient undertands incorporating physical activity into lifestyle.  Demonstrates understanding / competency    Patient understands using medications safely.  Demonstrates understanding / competency    Patient understands monitoring blood glucose, interpreting and using results  Demonstrates understanding / competency    Patient understands prevention, detection, and treatment of acute complications.  Demonstrates understanding / competency    Patient understands prevention, detection, and treatment of chronic complications.  Demonstrates understanding / competency    Patient understands how to develop strategies to address psychosocial issues.   Demonstrates understanding / competency    Patient understands how to develop strategies to promote health/change behavior.  Demonstrates understanding / competency      Outcomes   Expected Outcomes  Demonstrated interest in learning. Expect positive outcomes    Future DMSE  PRN    Program Status  Not Completed       Individualized Plan for Diabetes Self-Management Training:   Learning Objective:  Patient will have a greater understanding of diabetes self-management. Patient education plan is to attend individual and/or group sessions per assessed needs and concerns.   Plan:   Patient Instructions  Plan:  Aim for 4 Carb Choices per meal (60 grams) +/- 1 either way  Aim for 0-2 Carbs per snack if hungry  Include lean protein  in moderation with your meals and snacks Consider  increasing your activity level by walking or other activities daily as tolerated Consider the idea of getting a meter and checking BG at alternate times per day on different days of the week  Continue taking medication as directed by MD  Expected Outcomes:  Demonstrated interest in learning. Expect positive outcomes  Education material provided: ADA - How to Thrive: A Guide for Your Journey with Diabetes, A1C conversion sheet, Meal plan card and Carbohydrate counting sheet  If problems or questions, patient to contact team via:  Phone  Future DSME appointment: PRN

## 2018-10-11 MED FILL — ROSUVASTATIN CALCIUM 20 MG: 20 | 30 days supply | Qty: 30 | Fill #2

## 2018-10-18 MED FILL — HYDROCHLOROTHIAZIDE 25 MG T: 25 | 30 days supply | Qty: 30 | Fill #9

## 2018-10-18 MED FILL — ALLOPURINOL 300 MG TAB: 300 | 30 days supply | Qty: 30 | Fill #9

## 2018-10-31 MED FILL — metFORMIN HCL 500 MG TABS: 500 | 30 days supply | Qty: 60 | Fill #1

## 2018-11-09 ENCOUNTER — Other Ambulatory Visit: Payer: Self-pay | Admitting: Medical

## 2018-11-09 MED ORDER — ROSUVASTATIN CALCIUM 20 MG PO TABS: 20.0000 mg | ORAL_TABLET | Freq: Every day | ORAL | 2 refills | Status: DC

## 2018-11-09 MED FILL — ROSUVASTATIN CALCIUM 20 MG: 20 | 30 days supply | Qty: 30 | Fill #0

## 2018-11-09 NOTE — Telephone Encounter (Signed)
Medication Refill - Medication: rosuvastatin (CRESTOR) 20 MG tablet    Has the patient contacted their pharmacy? Yes.   (Agent: If no, request that the patient contact the pharmacy for the refill.) (Agent: If yes, when and what did the pharmacy advise?)  Preferred Pharmacy (with phone number or street name): [facility]   Agent: Please be advised that RX refills may take up to 3 business days. We ask that you follow-up with your pharmacy.

## 2018-11-22 MED FILL — ALLOPURINOL 300 MG TAB: 300 | 30 days supply | Qty: 30 | Fill #10

## 2018-11-22 MED FILL — HYDROCHLOROTHIAZIDE 25 MG T: 25 | 30 days supply | Qty: 30 | Fill #10

## 2018-12-11 MED FILL — ROSUVASTATIN CALCIUM 20 MG: 20 | 30 days supply | Qty: 30 | Fill #1

## 2018-12-11 MED FILL — metFORMIN HCL 500 MG TABS: 500 | 30 days supply | Qty: 60 | Fill #2

## 2019-01-02 MED FILL — ALLOPURINOL 300 MG TABS: 300 | 30 days supply | Qty: 30 | Fill #0

## 2019-01-02 MED FILL — HYDROCHLOROTHIAZIDE 25 MG T: 25 | 30 days supply | Qty: 30 | Fill #0

## 2019-01-11 MED FILL — metFORMIN HCL 500 MG TABS: 500 | 30 days supply | Qty: 60 | Fill #3

## 2019-01-15 MED FILL — ROSUVASTATIN CALCIUM 20 MG: 20 | 30 days supply | Qty: 30 | Fill #2

## 2019-02-06 MED FILL — HYDROCHLOROTHIAZIDE 25 MG T: 25 | 30 days supply | Qty: 30 | Fill #1

## 2019-02-06 MED FILL — ALLOPURINOL 300 MG TABS: 300 | 30 days supply | Qty: 30 | Fill #1

## 2019-02-12 ENCOUNTER — Other Ambulatory Visit: Payer: Self-pay | Admitting: Medical

## 2019-02-13 MED FILL — metFORMIN HCL 500 MG TABS: 500 | 30 days supply | Qty: 60 | Fill #0

## 2019-02-13 NOTE — Telephone Encounter (Signed)
appt schedule with pt  

## 2019-02-13 NOTE — Telephone Encounter (Signed)
Pt is due for follow up

## 2019-02-20 ENCOUNTER — Other Ambulatory Visit: Payer: Self-pay | Admitting: Medical

## 2019-02-20 MED ORDER — ROSUVASTATIN CALCIUM 20 MG PO TABS: 20.0000 mg | ORAL_TABLET | Freq: Every day | ORAL | 2 refills | Status: DC

## 2019-02-20 MED FILL — ROSUVASTATIN CALCIUM 20 MG: 20 | 30 days supply | Qty: 30 | Fill #0

## 2019-02-20 NOTE — Telephone Encounter (Signed)
Requested Prescriptions  Pending Prescriptions Disp Refills  . rosuvastatin (CRESTOR) 20 MG tablet 30 tablet 2    Sig: Take 1 tablet (20 mg total) by mouth daily.     Cardiovascular:  Antilipid - Statins Failed - 02/20/2019  9:47 AM      Failed - HDL in normal range and within 360 days    HDL  Date Value Ref Range Status  09/04/2018 33.60 (L) >39.00 mg/dL Final         Passed - Total Cholesterol in normal range and within 360 days    Cholesterol  Date Value Ref Range Status  09/04/2018 135 0 - 200 mg/dL Final    Comment:    ATP III Classification       Desirable:  < 200 mg/dL               Borderline High:  200 - 239 mg/dL          High:  > = 240 mg/dL         Passed - LDL in normal range and within 360 days    LDL Cholesterol  Date Value Ref Range Status  09/04/2018 73 0 - 99 mg/dL Final         Passed - Triglycerides in normal range and within 360 days    Triglycerides  Date Value Ref Range Status  09/04/2018 142.0 0.0 - 149.0 mg/dL Final    Comment:    Normal:  <150 mg/dLBorderline High:  150 - 199 mg/dL         Passed - Patient is not pregnant      Passed - Valid encounter within last 12 months    Recent Outpatient Visits          6 months ago Essential hypertension   [facility] at [place], [place]   10 months ago Wellness examination   [facility] at [place], PA-C   1 year ago Acute maxillary sinusitis, recurrence not specified   Archivist at [place], [place]   2 years ago Essential hypertension   Archivist at [place], [place]   2 years ago Essential hypertension   [facility] at [place], [employer] [facility]            In 1 week [place], [employer], PA-C [employer] at [employer], [facility]

## 2019-02-20 NOTE — Telephone Encounter (Signed)
Medication Refill - Medication: rosuvastatin (CRESTOR) 20 MG tablet Pt is out of medication.  Has the patient contacted their pharmacy? Yes.   (Agent: If no, request that the patient contact the pharmacy for the refill.) (Agent: If yes, when and what did the pharmacy advise?)  Preferred Pharmacy (with phone number or street name):  [place], [place] - [place] [phone] (Phone) [phone] (Fax)     Agent: Please be advised that RX refills may take up to 3 business days. We ask that you follow-up with your pharmacy.

## 2019-02-22 ENCOUNTER — Encounter: Payer: Self-pay | Admitting: Medical

## 2019-02-26 ENCOUNTER — Other Ambulatory Visit: Payer: Self-pay

## 2019-02-27 ENCOUNTER — Encounter: Payer: Self-pay | Admitting: Medical

## 2019-02-27 ENCOUNTER — Other Ambulatory Visit: Payer: Self-pay

## 2019-02-27 ENCOUNTER — Ambulatory Visit: Payer: 59 | Admitting: Medical

## 2019-02-27 VITALS — BP 129/71 | HR 68 | Temp 96.7°F | Resp 12 | Ht 75.0 in | Wt 232.8 lb

## 2019-02-27 DIAGNOSIS — E785 Hyperlipidemia, unspecified: Secondary | ICD-10-CM | POA: Diagnosis not present

## 2019-02-27 DIAGNOSIS — E119 Type 2 diabetes mellitus without complications: Secondary | ICD-10-CM | POA: Diagnosis not present

## 2019-02-27 DIAGNOSIS — Z85528 Personal history of other malignant neoplasm of kidney: Secondary | ICD-10-CM

## 2019-02-27 DIAGNOSIS — I10 Essential (primary) hypertension: Secondary | ICD-10-CM

## 2019-02-27 LAB — COMPREHENSIVE METABOLIC PANEL
ALT: 25 U/L (ref 0–53)
AST: 33 U/L (ref 0–37)
Albumin: 4.8 g/dL (ref 3.5–5.2)
Alkaline Phosphatase: 113 U/L (ref 39–117)
BUN: 20 mg/dL (ref 6–23)
CO2: 29 mEq/L (ref 19–32)
Calcium: 9.5 mg/dL (ref 8.4–10.5)
Chloride: 101 mEq/L (ref 96–112)
Creatinine, Ser: 0.93 mg/dL (ref 0.40–1.50)
GFR: 81.96 mL/min (ref >60.00)
Glucose, Bld: 111 mg/dL — ABNORMAL HIGH (ref 70–99)
Potassium: 4.4 mEq/L (ref 3.5–5.1)
Sodium: 138 mEq/L (ref 135–145)
Total Bilirubin: 0.9 mg/dL (ref 0.2–1.2)
Total Protein: 7.6 g/dL (ref 6.0–8.3)

## 2019-02-27 LAB — LIPID PANEL
Cholesterol: 118 mg/dL (ref 0–200)
HDL: 32.1 mg/dL — ABNORMAL LOW (ref >39.00)
LDL Cholesterol: 56 mg/dL (ref 0–99)
NonHDL: 85.46
Total CHOL/HDL Ratio: 4
Triglycerides: 149 mg/dL (ref 0.0–149.0)
VLDL: 29.8 mg/dL (ref 0.0–40.0)

## 2019-02-27 LAB — HEMOGLOBIN A1C: Hgb A1c MFr Bld: 6.2 % (ref 4.6–6.5)

## 2019-02-27 NOTE — Progress Notes (Signed)
Subjective:    Patient ID: Brian Sawyer, male    DOB: 05/02/1955, 63 y.o.   MRN: [id number]  HPI  Pt in for follow up.  He is fasting. Did take medications.    Pt states has lost 10 lbs since he started to eat better. He went to diabetic educator. He found this was useful. He learned a lot.  Pt is diabetic educator. His a1c was 6.9.  He is on metformin.  Pt blood pressure controlled today.  Pt is on crestor.  Pt did get flu vaccine already.  Pt updates me that he followed up with urologist. His psa was normal/good and he got a DRE. He reminds me had history of kidney cancer. Pt has some RBC in his urine. New urologist did xray, [place] and mri. Pt states cystoscopy was recommended but he is reluctant to do. Pt still thinking of doing that.     Review of Systems  Constitutional: Negative for chills, fatigue and fever.  HENT: Negative for dental problem.   Respiratory: Negative for cough, chest tightness, shortness of breath and wheezing.   Cardiovascular: Negative for chest pain and palpitations.  Gastrointestinal: Negative for abdominal pain.  Genitourinary: Negative for difficulty urinating, dysuria, flank pain, frequency, hematuria, testicular pain and urgency.  Musculoskeletal: Negative for back pain.       Describes self limited back pain daily basis. Lower back after seated for long time working from home in uncomfortable chair.  Skin: Negative for rash.  Neurological: Negative for dizziness and headaches.  Hematological: Negative for adenopathy. Does not bruise/bleed easily.  Psychiatric/Behavioral: Negative for behavioral problems, confusion, sleep disturbance and suicidal ideas. The patient is not nervous/anxious.     Past Medical History:  Diagnosis Date  . Difficult intubation    on 04/17/03 [facility] rec: awake or asleep fiberoptic intubation, kidney surgery  . Diverticulosis   . GERD (gastroesophageal reflux disease)   . Gout   . History of kidney cancer  2006   left   . Hypertension   . Kidney stones    right     Social History   Socioeconomic History  . Marital status: Married    Spouse name: Not on file  . Number of children: Not on file  . Years of education: Not on file  . Highest education level: Not on file  Occupational History  . Not on file  Tobacco Use  . Smoking status: Former Smoker    Years: 4.00    Types: Cigarettes    Quit date: 04/07/1976    Years since quitting: 42.9  . Smokeless tobacco: Never Used  Substance and Sexual Activity  . Alcohol use: Yes    Alcohol/week: 0.0 standard drinks    Comment: rare  . Drug use: No  . Sexual activity: Not on file  Other Topics Concern  . Not on file  Social History Narrative  . Not on file   Social Determinants of Health   Financial Resource Strain:   . Difficulty of Paying Living Expenses: Not on file  Food Insecurity:   . Worried About Charity fundraiser in the Last Year: Not on file  . Ran Out of Food in the Last Year: Not on file  Transportation Needs:   . Lack of Transportation (Medical): Not on file  . Lack of Transportation (Non-Medical): Not on file  Physical Activity:   . Days of Exercise per Week: Not on file  . Minutes of Exercise per Session:  Not on file  Stress:   . Feeling of Stress : Not on file  Social Connections:   . Frequency of Communication with Friends and Family: Not on file  . Frequency of Social Gatherings with Friends and Family: Not on file  . Attends Religious Services: Not on file  . Active Member of Clubs or Organizations: Not on file  . Attends Archivist Meetings: Not on file  . Marital Status: Not on file  Intimate Partner Violence:   . Fear of Current or Ex-Partner: Not on file  . Emotionally Abused: Not on file  . Physically Abused: Not on file  . Sexually Abused: Not on file    Past Surgical History:  Procedure Laterality Date  . COLONOSCOPY WITH PROPOFOL N/A 07/04/2014   Procedure: COLONOSCOPY WITH  PROPOFOL;  Surgeon: Milus Banister, MD;  Location: [facility];  Service: Endoscopy;  Laterality: N/A;  . 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 EXCISION OF ORAL TUMOR    . PARTIAL NEPHRECTOMY Left 2006  . SURGERY SCROTAL / TESTICULAR  1980s  . TOOTH EXTRACTION N/A 05/14/2013   Procedure: REMOVAL OF SUPERNUMERARY TOOTH NUMBER FIFTY-NINE WITH BONE GRAFTING;  Surgeon: Isac Caddy, DDS;  Location: [facility];  Service: Oral Surgery;  Laterality: N/A;    Family History  Adopted: Yes    Allergies  Allergen Reactions  . Zocor [Simvastatin] Other (See Comments)    Patient states 'made him feel bad'    Current Outpatient Medications on File Prior to Visit  Medication Sig Dispense Refill  . allopurinol (ZYLOPRIM) 300 MG tablet Take 300 mg by mouth daily.    Marland Kitchen aspirin 81 MG EC tablet Take 81 mg by mouth daily. Swallow whole.    . fluticasone (FLONASE) 50 MCG/ACT nasal spray Place 2 sprays into both nostrils daily. 16 g 1  . hydrochlorothiazide (HYDRODIURIL) 25 MG tablet Take 25 mg by mouth every morning.     . metFORMIN (GLUCOPHAGE) 500 MG tablet TAKE 1 TABLET (500 MG TOTAL) BY MOUTH 2 (TWO) TIMES DAILY WITH A MEAL. 60 tablet 0  . rosuvastatin (CRESTOR) 20 MG tablet Take 1 tablet (20 mg total) by mouth daily. 30 tablet 2   No current facility-administered medications on file prior to visit.    BP 129/71 (BP Location: Right Arm, Cuff Size: Large)   Pulse 68   Temp (!) 96.7 F (35.9 C) (Temporal)   Resp 12   Ht 6\' 3"  (1.905 m)   Wt 232 lb 12.8 oz (105.6 kg)   SpO2 98%   BMI 29.10 kg/m       Objective:   Physical Exam  General Mental Status- Alert. General Appearance- Not in acute distress.   Skin General: Color- Normal Color. Moisture- Normal Moisture.  Neck Carotid Arteries- Normal color.  Moisture- Normal Moisture. No carotid bruits. No JVD.  Chest and Lung Exam Auscultation: Breath Sounds:-Normal.  Cardiovascular Auscultation:Rythm- Regular. Murmurs & Other Heart Sounds:Auscultation of the heart reveals- No Murmurs.  Abdomen Inspection:-Inspeection Normal. Palpation/Percussion:Note:No mass. Palpation and Percussion of the abdomen reveal- Non Tender, Non Distended + BS, no rebound or guarding.   Neurologic Cranial Nerve exam:- CN III-XII intact(No nystagmus), symmetric smile. Strength:- 5/5 equal and symmetric strength both upper and lower extremities.      Assessment & Plan:  For  htn continue current medical management.   For diabetes, will get a1c and cmp. Will get urine microalbumin.  For high cholesterol, will check lipid panel.  Do recommend you carefully consider urologist recommendation.  If self limited type  back pain more severe/constant then recommend xray.   Follow up date to be determined after lab review.  25 + minutes spent with pt. 50% if time spent counseling on plan going forward.

## 2019-02-27 NOTE — Patient Instructions (Addendum)
For htn continue current medical management.   For diabetes, will get a1c and cmp. Will get urine microalbumin.  For high cholesterol, will check lipid panel.  Do recommend you carefully consider urologist recommendation for hx of kidney cancer  If self limited type  back pain more severe/constant then recommend xray.(let me know and can but I order)  Follow up date to be determined after lab review.

## 2019-02-28 LAB — MICROALBUMIN, URINE: Microalb, Ur: 7.3 mg/dL

## 2019-03-07 MED FILL — ALLOPURINOL 300 MG TABS: 300 | 30 days supply | Qty: 30 | Fill #2

## 2019-03-07 MED FILL — HYDROCHLOROTHIAZIDE 25 MG T: 25 | 30 days supply | Qty: 30 | Fill #2

## 2019-03-19 MED FILL — ROSUVASTATIN CALCIUM 20 MG: 20 | 30 days supply | Qty: 30 | Fill #1

## 2019-03-20 ENCOUNTER — Other Ambulatory Visit: Payer: Self-pay | Admitting: Medical

## 2019-03-20 MED FILL — metFORMIN HCL 500 MG TABS: 500 | 30 days supply | Qty: 60 | Fill #0

## 2019-04-16 ENCOUNTER — Other Ambulatory Visit (HOSPITAL_COMMUNITY): Payer: Self-pay | Admitting: Urology

## 2019-04-16 ENCOUNTER — Other Ambulatory Visit: Payer: Self-pay | Admitting: Medical

## 2019-04-16 MED FILL — ALLOPURINOL 300 MG TABS: 300 | 30 days supply | Qty: 30 | Fill #0

## 2019-04-16 MED FILL — HYDROCHLOROTHIAZIDE 25 MG T: 25 | 30 days supply | Qty: 30 | Fill #0

## 2019-04-16 MED FILL — ROSUVASTATIN CALCIUM 20 MG: 20 | 30 days supply | Qty: 30 | Fill #2

## 2019-04-17 MED FILL — metFORMIN HCL 500 MG TABS: 500 | 30 days supply | Qty: 60 | Fill #0

## 2019-05-23 MED FILL — HYDROCHLOROTHIAZIDE 25 MG T: 25 | 30 days supply | Qty: 30 | Fill #1

## 2019-05-23 MED FILL — METFORMIN HCL 500 MG TABS: 500 | 30 days supply | Qty: 60 | Fill #1

## 2019-05-23 MED FILL — ROSUVASTATIN CALCIUM 20 MG: 20 | 30 days supply | Qty: 30 | Fill #0

## 2019-05-23 MED FILL — ALLOPURINOL 300 MG TABS: 300 | 30 days supply | Qty: 30 | Fill #1

## 2019-06-01 ENCOUNTER — Other Ambulatory Visit: Payer: Self-pay

## 2019-06-01 MED ORDER — ROSUVASTATIN CALCIUM 20 MG PO TABS: 20.0000 mg | ORAL_TABLET | Freq: Every day | ORAL | 2 refills | Status: DC

## 2019-06-20 MED FILL — HYDROCHLOROTHIAZIDE 25 MG T: 25 | 30 days supply | Qty: 30 | Fill #2

## 2019-06-20 MED FILL — ROSUVASTATIN CALCIUM 20 MG: 20 | 30 days supply | Qty: 30 | Fill #1

## 2019-06-20 MED FILL — ALLOPURINOL 300 MG TABS: 300 | 30 days supply | Qty: 30 | Fill #2

## 2019-06-20 MED FILL — METFORMIN HCL 500 MG TABS: 500 | 30 days supply | Qty: 60 | Fill #2

## 2019-08-24 ENCOUNTER — Other Ambulatory Visit: Payer: Self-pay

## 2019-08-24 ENCOUNTER — Other Ambulatory Visit: Payer: Self-pay | Admitting: Medical

## 2019-08-24 MED ORDER — ROSUVASTATIN CALCIUM 20 MG PO TABS: 20.0000 mg | ORAL_TABLET | Freq: Every day | ORAL | 2 refills | Status: DC

## 2019-10-02 MED FILL — HYDROCHLOROTHIAZIDE 25 MG T: 25 | 30 days supply | Qty: 30 | Fill #5

## 2019-10-02 MED FILL — ROSUVASTATIN CALCIUM 20 MG: 20 | 30 days supply | Qty: 30 | Fill #1

## 2019-10-02 MED FILL — ALLOPURINOL 300 MG TABS: 300 | 30 days supply | Qty: 30 | Fill #5

## 2019-10-02 MED FILL — METFORMIN HCL 500 MG TABS: 500 | 30 days supply | Qty: 60 | Fill #1

## 2019-10-31 MED FILL — ROSUVASTATIN CALCIUM 20 MG: 20 | 30 days supply | Qty: 30 | Fill #2

## 2019-10-31 MED FILL — ALLOPURINOL 300 MG TABS: 300 | 30 days supply | Qty: 30 | Fill #6

## 2019-10-31 MED FILL — METFORMIN HCL 500 MG TABS: 500 | 30 days supply | Qty: 60 | Fill #2

## 2019-10-31 MED FILL — HYDROCHLOROTHIAZIDE 25 MG T: 25 | 30 days supply | Qty: 30 | Fill #6

## 2019-12-07 MED FILL — METFORMIN HCL 500 MG TABS: 500 | 30 days supply | Qty: 60 | Fill #3

## 2019-12-07 MED FILL — ALLOPURINOL 300 MG TABS: 300 | 30 days supply | Qty: 30 | Fill #7

## 2019-12-07 MED FILL — HYDROCHLOROTHIAZIDE 25 MG T: 25 | 30 days supply | Qty: 30 | Fill #7

## 2019-12-07 MED FILL — ROSUVASTATIN CALCIUM 20 MG: 20 | 30 days supply | Qty: 30 | Fill #0

## 2020-01-01 ENCOUNTER — Other Ambulatory Visit: Payer: Self-pay | Admitting: Medical

## 2020-01-01 MED FILL — METFORMIN HCL 500 MG TABS: 500 | 30 days supply | Qty: 60 | Fill #0

## 2020-01-02 MED FILL — ROSUVASTATIN CALCIUM 20 MG: 20 | 30 days supply | Qty: 30 | Fill #1

## 2020-01-02 MED FILL — ALLOPURINOL 300 MG TABS: 300 | 30 days supply | Qty: 30 | Fill #8

## 2020-01-05 MED FILL — HYDROCHLOROTHIAZIDE 25 MG T: 25 | 30 days supply | Qty: 30 | Fill #8

## 2020-01-31 ENCOUNTER — Other Ambulatory Visit (HOSPITAL_COMMUNITY): Payer: Self-pay | Admitting: Dermatology

## 2020-01-31 MED FILL — BETAMETHASONE DP AUG 0.05%: 0.05 | 15 days supply | Qty: 15 | Fill #0

## 2020-02-04 ENCOUNTER — Other Ambulatory Visit: Payer: Self-pay | Admitting: Medical

## 2020-02-04 MED FILL — METFORMIN HCL 500 MG TABS: 500 | 30 days supply | Qty: 60 | Fill #0

## 2020-02-04 MED FILL — ROSUVASTATIN CALCIUM 20 MG: 20 | 30 days supply | Qty: 30 | Fill #2

## 2020-02-04 MED FILL — HYDROCHLOROTHIAZIDE 25 MG T: 25 | 30 days supply | Qty: 30 | Fill #9

## 2020-02-04 MED FILL — ALLOPURINOL 300 MG TABS: 300 | 30 days supply | Qty: 30 | Fill #9

## 2020-03-11 ENCOUNTER — Other Ambulatory Visit: Payer: Self-pay | Admitting: Medical

## 2020-03-11 MED FILL — HYDROCHLOROTHIAZIDE 25 MG T: 25 | 30 days supply | Qty: 30 | Fill #10

## 2020-03-11 MED FILL — ROSUVASTATIN CALCIUM 20 MG: 20 | 30 days supply | Qty: 30 | Fill #0

## 2020-03-11 MED FILL — ALLOPURINOL 300 MG TABS: 300 | 30 days supply | Qty: 30 | Fill #10

## 2020-03-11 MED FILL — METFORMIN HCL 500 MG TABS: 500 | 30 days supply | Qty: 60 | Fill #0

## 2020-04-12 ENCOUNTER — Other Ambulatory Visit: Payer: Self-pay | Admitting: Medical

## 2020-04-12 MED FILL — HYDROCHLOROTHIAZIDE 25 MG T: 25 | 30 days supply | Qty: 30 | Fill #11

## 2020-04-12 MED FILL — ROSUVASTATIN CALCIUM 20 MG: 20 | 30 days supply | Qty: 30 | Fill #1

## 2020-04-12 MED FILL — ALLOPURINOL 300 MG TABS: 300 | 30 days supply | Qty: 30 | Fill #11

## 2020-04-14 ENCOUNTER — Other Ambulatory Visit: Payer: Self-pay | Admitting: Medical

## 2020-04-14 MED FILL — METFORMIN HCL 500 MG TABS: 500 | 3 days supply | Qty: 60 | Fill #0

## 2020-05-13 ENCOUNTER — Ambulatory Visit: Payer: 59 | Admitting: Medical

## 2020-05-13 DIAGNOSIS — Z0289 Encounter for other administrative examinations: Secondary | ICD-10-CM

## 2020-05-13 MED FILL — ROSUVASTATIN CALCIUM 20 MG: 20 | 30 days supply | Qty: 30 | Fill #2

## 2020-05-16 ENCOUNTER — Encounter: Payer: Self-pay | Admitting: Medical

## 2020-05-16 ENCOUNTER — Other Ambulatory Visit: Payer: Self-pay

## 2020-05-16 ENCOUNTER — Other Ambulatory Visit: Payer: Self-pay | Admitting: Medical

## 2020-05-16 ENCOUNTER — Ambulatory Visit: Payer: 59 | Admitting: Medical

## 2020-05-16 VITALS — BP 140/80 | HR 73 | Temp 97.6°F | Resp 18 | Ht 75.0 in | Wt 244.0 lb

## 2020-05-16 DIAGNOSIS — Z0000 Encounter for general adult medical examination without abnormal findings: Secondary | ICD-10-CM

## 2020-05-16 DIAGNOSIS — E119 Type 2 diabetes mellitus without complications: Secondary | ICD-10-CM | POA: Diagnosis not present

## 2020-05-16 DIAGNOSIS — M109 Gout, unspecified: Secondary | ICD-10-CM | POA: Diagnosis not present

## 2020-05-16 DIAGNOSIS — E785 Hyperlipidemia, unspecified: Secondary | ICD-10-CM | POA: Diagnosis not present

## 2020-05-16 DIAGNOSIS — K635 Polyp of colon: Secondary | ICD-10-CM

## 2020-05-16 DIAGNOSIS — M545 Low back pain, unspecified: Secondary | ICD-10-CM | POA: Diagnosis not present

## 2020-05-16 DIAGNOSIS — I10 Essential (primary) hypertension: Secondary | ICD-10-CM | POA: Diagnosis not present

## 2020-05-16 DIAGNOSIS — Z125 Encounter for screening for malignant neoplasm of prostate: Secondary | ICD-10-CM | POA: Diagnosis not present

## 2020-05-16 DIAGNOSIS — G8929 Other chronic pain: Secondary | ICD-10-CM

## 2020-05-16 LAB — COMPREHENSIVE METABOLIC PANEL
ALT: 21 U/L (ref 0–53)
AST: 32 U/L (ref 0–37)
Albumin: 4.8 g/dL (ref 3.5–5.2)
Alkaline Phosphatase: 107 U/L (ref 39–117)
BUN: 14 mg/dL (ref 6–23)
CO2: 30 mEq/L (ref 19–32)
Calcium: 9.9 mg/dL (ref 8.4–10.5)
Chloride: 100 mEq/L (ref 96–112)
Creatinine, Ser: 0.87 mg/dL (ref 0.40–1.50)
GFR: 91.13 mL/min (ref >60.00)
Glucose, Bld: 112 mg/dL — ABNORMAL HIGH (ref 70–99)
Potassium: 4.7 mEq/L (ref 3.5–5.1)
Sodium: 140 mEq/L (ref 135–145)
Total Bilirubin: 0.9 mg/dL (ref 0.2–1.2)
Total Protein: 8 g/dL (ref 6.0–8.3)

## 2020-05-16 LAB — CBC WITH DIFFERENTIAL/PLATELET
Basophils Absolute: 0 10*3/uL (ref 0.0–0.1)
Basophils Relative: 0.3 % (ref 0.0–3.0)
Eosinophils Absolute: 0.2 10*3/uL (ref 0.0–0.7)
Eosinophils Relative: 3.7 % (ref 0.0–5.0)
HCT: 44.3 % (ref 39.0–52.0)
Hemoglobin: 14.8 g/dL (ref 13.0–17.0)
Lymphocytes Relative: 33 % (ref 12.0–46.0)
Lymphs Abs: 1.8 10*3/uL (ref 0.7–4.0)
MCHC: 33.4 g/dL (ref 30.0–36.0)
MCV: 88 fl (ref 78.0–100.0)
Monocytes Absolute: 0.4 10*3/uL (ref 0.1–1.0)
Monocytes Relative: 7.2 % (ref 3.0–12.0)
Neutro Abs: 3.1 10*3/uL (ref 1.4–7.7)
Neutrophils Relative %: 55.8 % (ref 43.0–77.0)
Platelets: 177 10*3/uL (ref 150.0–400.0)
RBC: 5.03 Mil/uL (ref 4.22–5.81)
RDW: 14.9 % (ref 11.5–15.5)
WBC: 5.6 10*3/uL (ref 4.0–10.5)

## 2020-05-16 LAB — LIPID PANEL
Cholesterol: 117 mg/dL (ref 0–200)
HDL: 34.7 mg/dL — ABNORMAL LOW (ref >39.00)
LDL Cholesterol: 46 mg/dL (ref 0–99)
NonHDL: 82.54
Total CHOL/HDL Ratio: 3
Triglycerides: 183 mg/dL — ABNORMAL HIGH (ref 0.0–149.0)
VLDL: 36.6 mg/dL (ref 0.0–40.0)

## 2020-05-16 LAB — HEMOGLOBIN A1C: Hgb A1c MFr Bld: 6.7 % — ABNORMAL HIGH (ref 4.6–6.5)

## 2020-05-16 LAB — PSA: PSA: 0.41 ng/mL (ref 0.10–4.00)

## 2020-05-16 LAB — URIC ACID: Uric Acid, Serum: 6.5 mg/dL (ref 4.0–7.8)

## 2020-05-16 MED ORDER — CHLORTHALIDONE 25 MG PO TABS
25.0000 mg | ORAL_TABLET | Freq: Every day | ORAL | 5 refills | Status: DC
Start: 2020-05-16 — End: 2020-05-16

## 2020-05-16 MED FILL — CHLORTHALIDONE 25 MG TABS: 25 | 30 days supply | Qty: 30 | Fill #0

## 2020-05-16 NOTE — Patient Instructions (Addendum)
For you wellness exam today I have ordered cbc, cmp and  lipid panel.  Flu vaccine declined.  Recommend exercise and healthy diet.  We will let you know lab results as they come in.  Follow up date appointment will be determined after lab review.   For htn will rx chlorthalidone 25 mg daily dose. Recommend some daily exercise and 5-10 lb wt loss.  For diabetes check cmp and a1c. Continue metformin and see if dose adjustment or new med needed.  For high cholesterol continue crestor. May need dose adjustment.  For gout check uric acid.  Check psa for prostate  CA screening.  Refer to gi for colonoscopy.  For chronic back pain will get lumbar spine xray. Pain minimal now compared to past. Will see if degenerative changes. Can use combination low dose tylenol and ibuprofen for pain.   Preventive Care 48-65 Years Old, Male Preventive care refers to lifestyle choices and visits with your health care provider that can promote health and wellness. This includes:  A yearly physical exam. This is also called an annual wellness visit.  Regular dental and eye exams.  Immunizations.  Screening for certain conditions.  Healthy lifestyle choices, such as: ? Eating a healthy diet. ? Getting regular exercise. ? Not using drugs or products that contain nicotine and tobacco. ? Limiting alcohol use. What can I expect for my preventive care visit? Physical exam Your health care provider will check your:  Height and weight. These may be used to calculate your BMI (body mass index). BMI is a measurement that tells if you are at a healthy weight.  Heart rate and blood pressure.  Body temperature.  Skin for abnormal spots. Counseling Your health care provider may ask you questions about your:  Past medical problems.  Family's medical history.  Alcohol, tobacco, and drug use.  Emotional well-being.  Home life and relationship well-being.  Sexual activity.  Diet, exercise, and  sleep habits.  Work and work Statistician.  Access to firearms. What immunizations do I need? Vaccines are usually given at various ages, according to a schedule. Your health care provider will recommend vaccines for you based on your age, medical history, and lifestyle or other factors, such as travel or where you work.   What tests do I need? Blood tests  Lipid and cholesterol levels. These may be checked every 5 years, or more often if you are over 85 years old.  Hepatitis C test.  Hepatitis B test. Screening  Lung cancer screening. You may have this screening every year starting at age 57 if you have a 30-pack-year history of smoking and currently smoke or have quit within the past 15 years.  Prostate cancer screening. Recommendations will vary depending on your family history and other risks.  Genital exam to check for testicular cancer or hernias.  Colorectal cancer screening. ? All adults should have this screening starting at age 31 and continuing until age 80. ? Your health care provider may recommend screening at age 50 if you are at increased risk. ? You will have tests every 1-10 years, depending on your results and the type of screening test.  Diabetes screening. ? This is done by checking your blood sugar (glucose) after you have not eaten for a while (fasting). ? You may have this done every 1-3 years.  STD (sexually transmitted disease) testing, if you are at risk. Follow these instructions at home: Eating and drinking  Eat a diet that includes fresh fruits and  vegetables, whole grains, lean protein, and low-fat dairy products.  Take vitamin and mineral supplements as recommended by your health care provider.  Do not drink alcohol if your health care provider tells you not to drink.  If you drink alcohol: ? Limit how much you have to 0-2 drinks a day. ? Be aware of how much alcohol is in your drink. In the [place] one drink equals one 12 oz bottle of beer (355  mL), one 5 oz glass of wine (148 mL), or one 1 oz glass of hard liquor (44 mL).   Lifestyle  Take daily care of your teeth and gums. Brush your teeth every morning and night with fluoride toothpaste. Floss one time each day.  Stay active. Exercise for at least 30 minutes 5 or more days each week.  Do not use any products that contain nicotine or tobacco, such as cigarettes, e-cigarettes, and chewing tobacco. If you need help quitting, ask your health care provider.  Do not use drugs.  If you are sexually active, practice safe sex. Use a condom or other form of protection to prevent STIs (sexually transmitted infections).  If told by your health care provider, take low-dose aspirin daily starting at age 24.  Find healthy ways to cope with stress, such as: ? Meditation, yoga, or listening to music. ? Journaling. ? Talking to a trusted person. ? Spending time with friends and family. Safety  Always wear your seat belt while driving or riding in a vehicle.  Do not drive: ? If you have been drinking alcohol. Do not ride with someone who has been drinking. ? When you are tired or distracted. ? While texting.  Wear a helmet and other protective equipment during sports activities.  If you have firearms in your house, make sure you follow all gun safety procedures. What's next?  Go to your health care provider once a year for an annual wellness visit.  Ask your health care provider how often you should have your eyes and teeth checked.  Stay up to date on all vaccines. This information is not intended to replace advice given to you by your health care provider. Make sure you discuss any questions you have with your health care provider. Document Revised: 11/28/2018 Document Reviewed: 02/23/2018 Elsevier Patient Education  2021 [employer].

## 2020-05-16 NOTE — Progress Notes (Addendum)
   Subjective:    Patient ID: Brian Sawyer, male    DOB: August 28, 1955, 65 y.o.   MRN: [id number]  HPI   Pt in for follow up.  2 years since last seen so will go ahead and do wellness exam.  Pt planning to retire end of the year. Pt has not been exercising. Moderate healthy diet. Non smoker. Very rare alcohol use.   Htn hx- on hctz 25 mg daily dose.  Hx of gout. But not  Hx of high cholesterol on crestor 20 mg daily.  Diabetes history and on metformin 500 mg po bid. a1c in 2020 was 6.2.  Pt has had covid vaccine and booster.  Pt does see dermatologist on regular basis.    Review of Systems  Constitutional: Negative for chills and fever.  Respiratory: Negative for chest tightness, shortness of breath and wheezing.   Cardiovascular: Negative for chest pain and palpitations.  Gastrointestinal: Negative for abdominal pain.  Genitourinary: Negative for decreased urine volume, dysuria, frequency, hematuria, penile discharge, penile swelling and testicular pain.  Musculoskeletal: Positive for back pain. Negative for neck pain and neck stiffness.       One year of nagging back pain. Some better recently. At times described severe. Worse with heavy lifting and bending over.  No radicular pain.  Skin: Negative for rash.  Neurological: Negative for dizziness, numbness and headaches.  Hematological: Negative for adenopathy. Does not bruise/bleed easily.  Psychiatric/Behavioral: Negative for behavioral problems and decreased concentration.       Objective:   Physical Exam  General Appearance- Not in acute distress.    Chest and Lung Exam Auscultation: Breath sounds:-Normal. Clear even and unlabored. Adventitious sounds:- No Adventitious sounds.  Cardiovascular Auscultation:Rythm - Regular, rate and rythm. Heart Sounds -Normal heart sounds.  Abdomen Inspection:-Inspection Normal.  Palpation/Perucssion: Palpation and Percussion of the abdomen reveal- Non Tender, No  Rebound tenderness, No rigidity(Guarding) and No Palpable abdominal masses.  Liver:-Normal.  Spleen:- Normal.   Back Mid lumbar spine tenderness to palpation. Pain on straight leg lift. Pain on lateral movements and flexion/extension of the spine.  Lower ext neurologic  L5-S1 sensation intact bilaterally. Normal patellar reflexes bilaterally. No foot drop bilaterally.      Assessment & Plan:  For you wellness exam today I have ordered cbc, cmp and  lipid panel.  Flu vaccine declined.  Recommend exercise and healthy diet.  We will let you know lab results as they come in.  Follow up date appointment will be determined after lab review.   For htn will rx chlorthalidone 25 mg daily dose. Recommend some daily exercise and 5-10 lb wt loss.  For diabetes check cmp and a1c. Continue metformin and see if dose adjustment or new med needed.  For high cholesterol continue crestor. May need dose adjustment.  For gout check uric acid.  Check psa for prostate  CA screening.  Refer to gi for colonoscopy.  For chronic back pain will get lumbar spine xray. Pain minimal now compared to past. Will see if degenerative changes.  [name], [place]   [id number] as addressed htn, diabetes, high cholesterol and back pain.

## 2020-05-17 LAB — MICROALBUMIN, URINE: Microalb, Ur: 13 mg/dL

## 2020-05-20 ENCOUNTER — Other Ambulatory Visit: Payer: Self-pay | Admitting: Medical

## 2020-05-20 MED FILL — ALLOPURINOL 300 MG TABS: 300 | 30 days supply | Qty: 30 | Fill #0

## 2020-05-20 MED FILL — METFORMIN HCL 500 MG TABS: 500 | 30 days supply | Qty: 60 | Fill #0

## 2020-05-21 ENCOUNTER — Encounter: Payer: Self-pay | Admitting: Gastroenterology

## 2020-06-20 ENCOUNTER — Other Ambulatory Visit (HOSPITAL_COMMUNITY): Payer: Self-pay

## 2020-06-20 ENCOUNTER — Other Ambulatory Visit: Payer: Self-pay | Admitting: Medical

## 2020-06-20 MED ORDER — ROSUVASTATIN CALCIUM 20 MG PO TABS
ORAL_TABLET | Freq: Every day | ORAL | 2 refills | Status: DC
  Filled 2020-06-20: qty 30, 30d supply, fill #0
  Filled 2020-07-21: qty 30, 30d supply, fill #1
  Filled 2020-08-18: qty 30, 30d supply, fill #2

## 2020-06-20 MED ORDER — METFORMIN HCL 500 MG PO TABS
ORAL_TABLET | ORAL | 0 refills | Status: DC
  Filled 2020-06-20: qty 60, 30d supply, fill #0

## 2020-06-20 MED FILL — Chlorthalidone Tab 25 MG: ORAL | 30 days supply | Qty: 30 | Fill #0 | Status: AC

## 2020-06-20 MED FILL — Allopurinol Tab 300 MG: ORAL | 30 days supply | Qty: 30 | Fill #0 | Status: AC

## 2020-06-21 ENCOUNTER — Other Ambulatory Visit (HOSPITAL_COMMUNITY): Payer: Self-pay

## 2020-06-26 IMAGING — DX DG SHOULDER 2+V*R*
3 series · 3 of 3 positions shown · non-contrast
Comparison: None.

CLINICAL DATA: Pain over the last 4 months.

EXAM:
RIGHT SHOULDER - 2+ VIEW

[shoulder grashey]
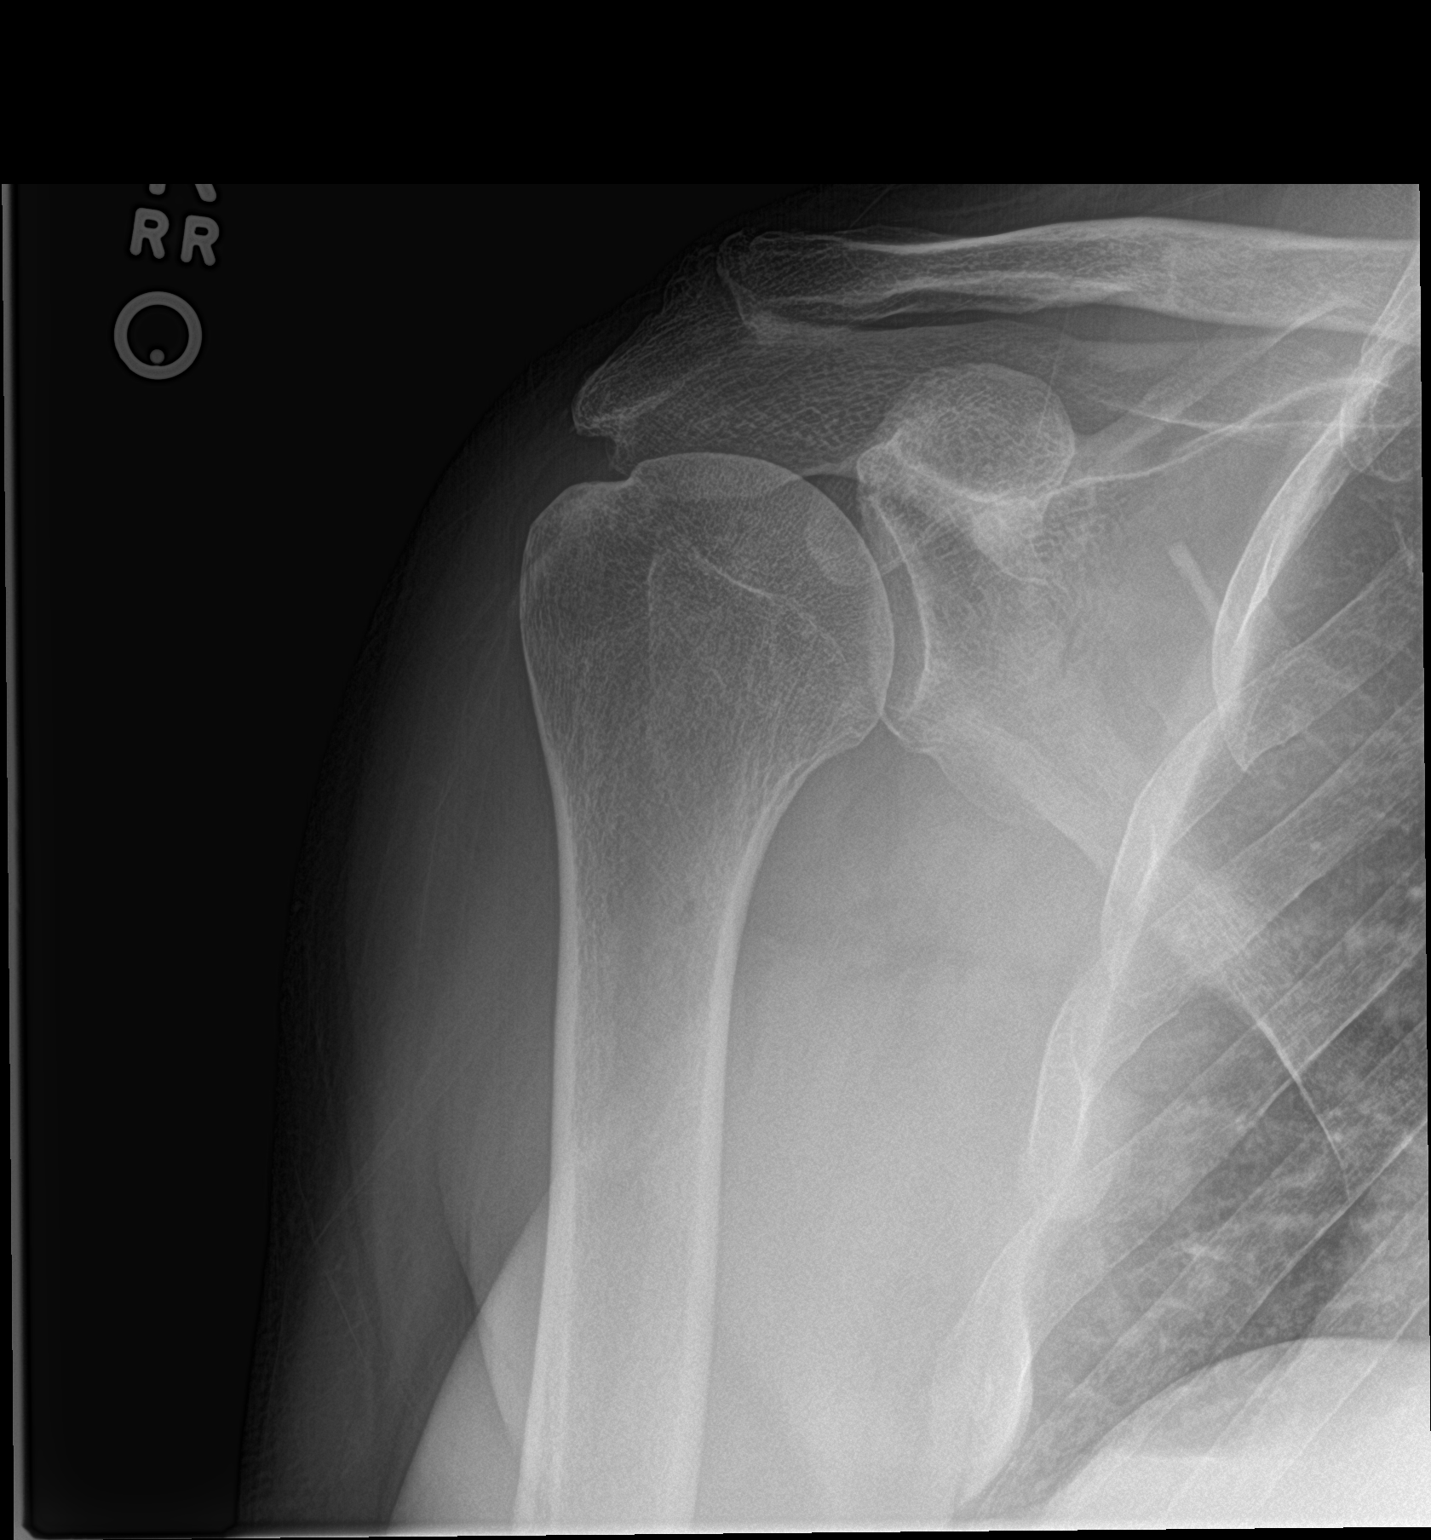

[shoulder y view]
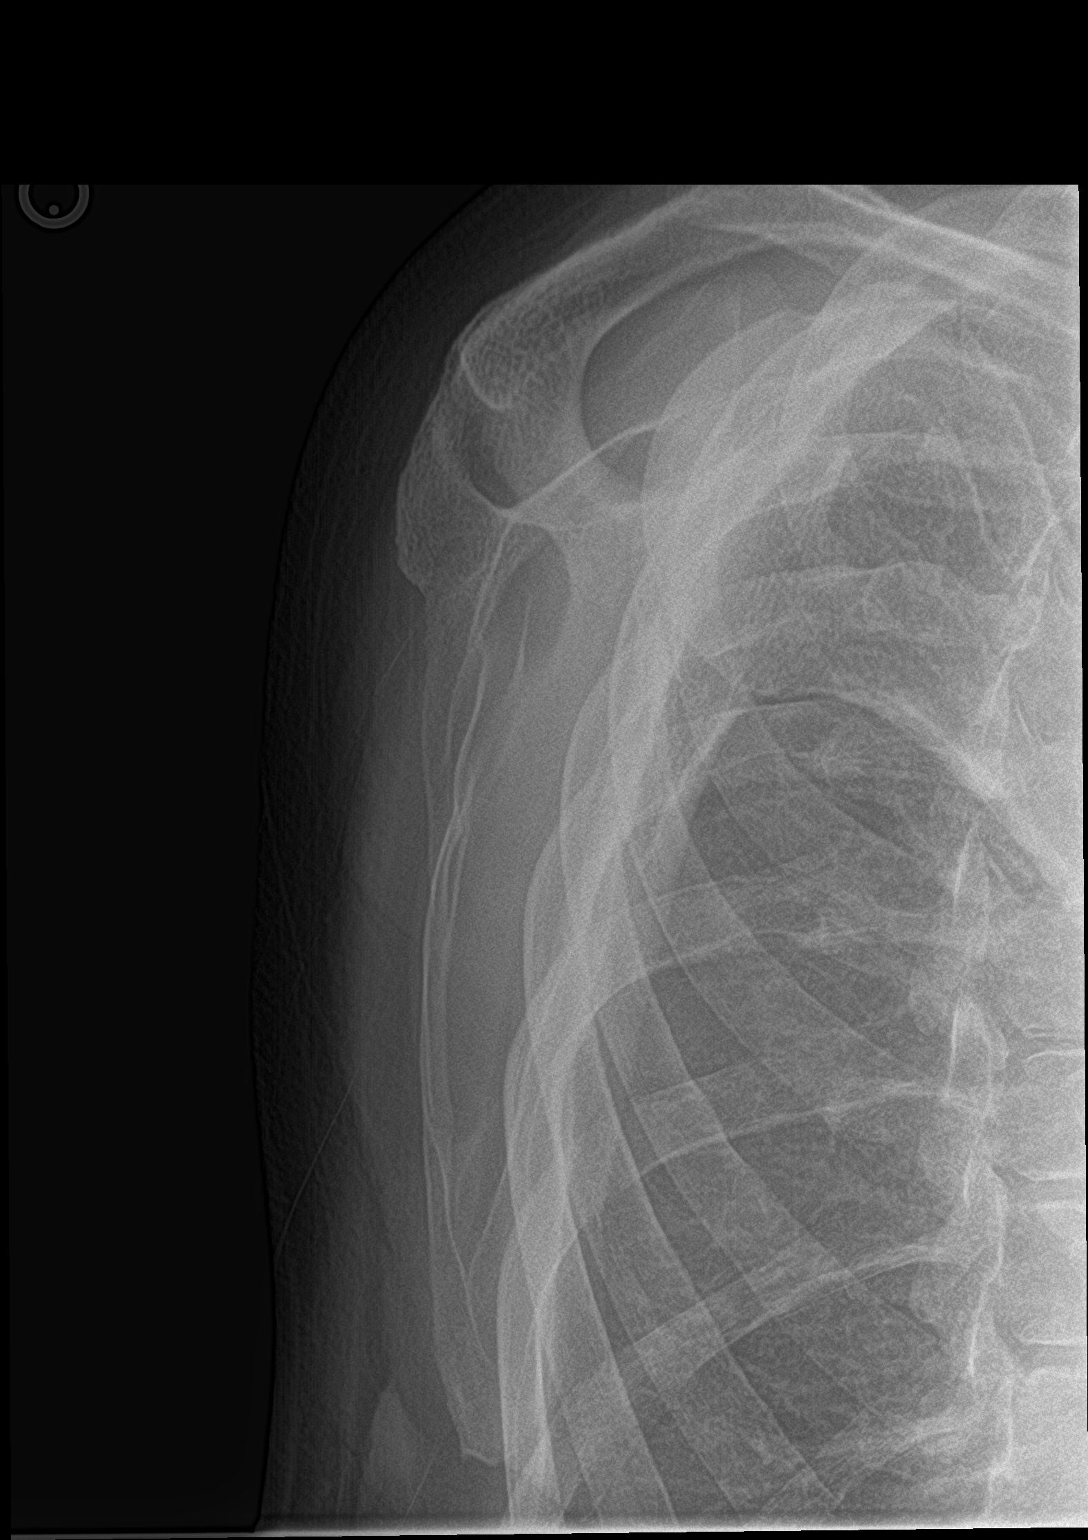

[shoulder axillary]
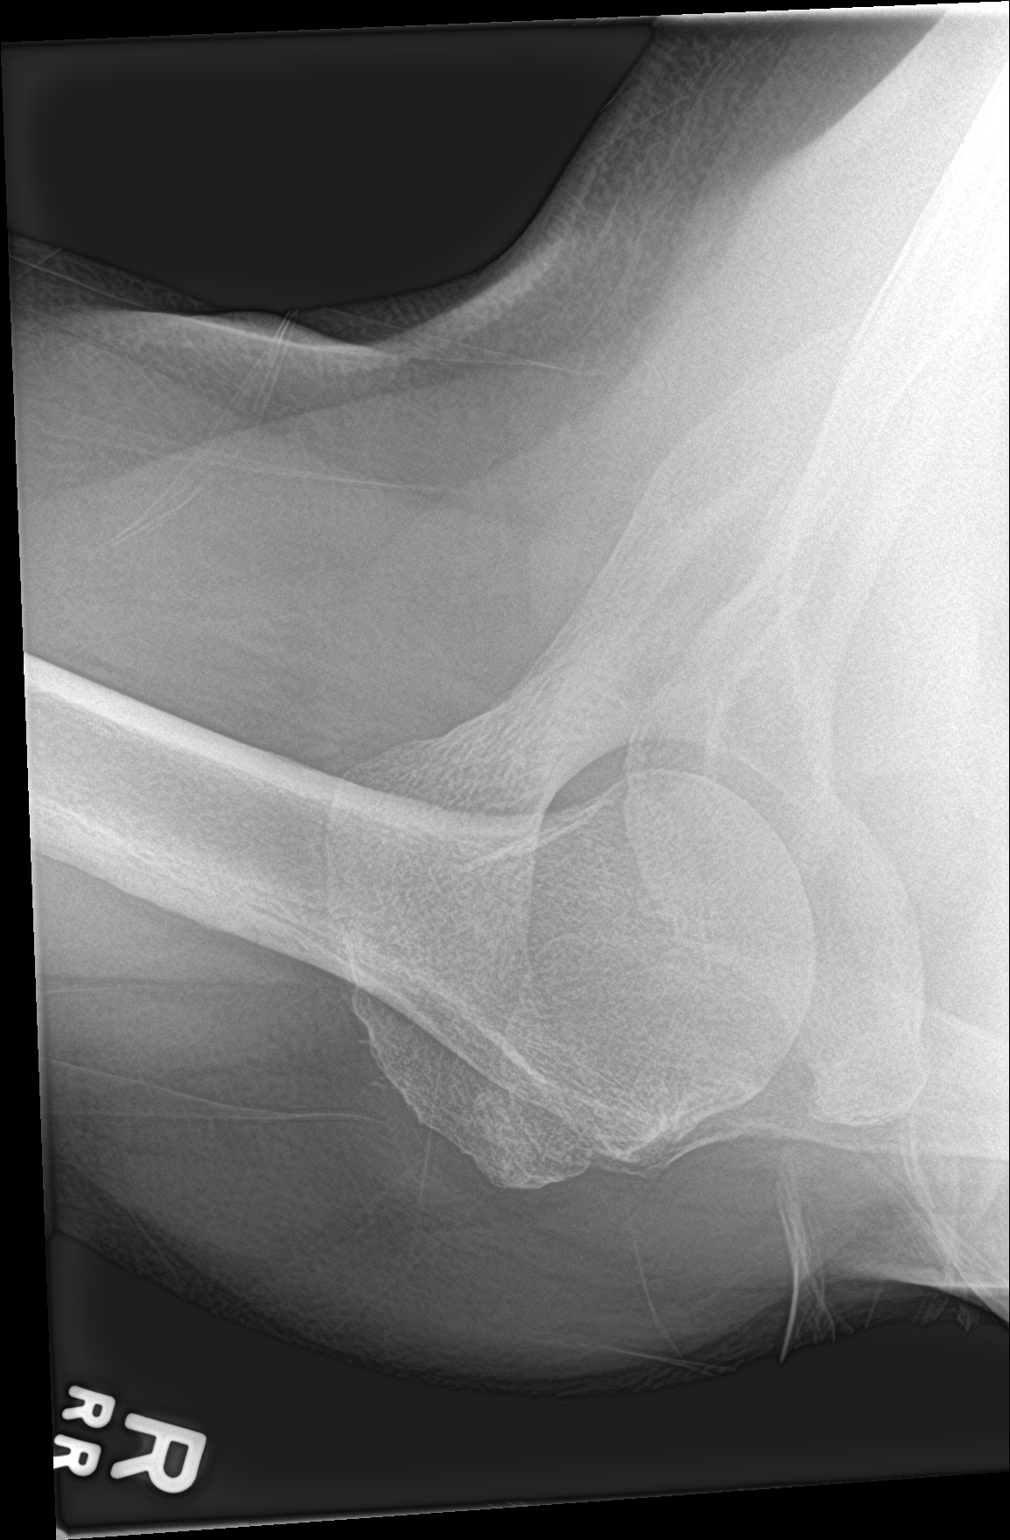

[3 of 3 positions shown; findings below may reference images not displayed]

FINDINGS: Glenohumeral joint is normal. Acromion is somewhat laterally
sloping, which can predispose to rotator cuff disease. AC joint
appears normal. No other regional finding.
IMPRESSION: Lateral sloping acromion which can predispose to rotator cuff
disease.

## 2020-07-02 ENCOUNTER — Telehealth: Payer: Self-pay | Admitting: *Deleted

## 2020-07-02 NOTE — Telephone Encounter (Signed)
Patient has a hx of difficult airway. Last colon 2016 was done at [facility]. See TE per Jenny Reichmann Nultly. Please schedule this recall colon at [facility]. Thank you, Jabril Pursell pv

## 2020-07-02 NOTE — Telephone Encounter (Signed)
Dr Ardis Hughs do you want this pt scheduled at the hospital or would you like an office visit first?

## 2020-07-03 ENCOUNTER — Other Ambulatory Visit: Payer: Self-pay

## 2020-07-03 DIAGNOSIS — Z86010 Personal history of colonic polyps: Secondary | ICD-10-CM

## 2020-07-03 NOTE — Telephone Encounter (Signed)
The pt appt has been changed to 6/2 at [facility].  The pt has been advised. He will keep previsit as planned.

## 2020-07-21 ENCOUNTER — Other Ambulatory Visit: Payer: Self-pay | Admitting: Medical

## 2020-07-21 ENCOUNTER — Other Ambulatory Visit (HOSPITAL_COMMUNITY): Payer: Self-pay

## 2020-07-21 ENCOUNTER — Ambulatory Visit (AMBULATORY_SURGERY_CENTER): Payer: Self-pay | Admitting: *Deleted

## 2020-07-21 ENCOUNTER — Other Ambulatory Visit: Payer: Self-pay

## 2020-07-21 VITALS — Ht 75.0 in | Wt 240.0 lb

## 2020-07-21 DIAGNOSIS — Z86010 Personal history of colonic polyps: Secondary | ICD-10-CM

## 2020-07-21 DIAGNOSIS — Z01818 Encounter for other preprocedural examination: Secondary | ICD-10-CM

## 2020-07-21 MED ORDER — METFORMIN HCL 500 MG PO TABS
ORAL_TABLET | ORAL | 0 refills | Status: DC
  Filled 2020-07-21: qty 60, 30d supply, fill #0

## 2020-07-21 MED ORDER — SUPREP BOWEL PREP KIT 17.5-3.13-1.6 GM/177ML PO SOLN
1.0000 | Freq: Once | ORAL | 0 refills | Status: AC
Start: 2020-07-21 — End: 2020-07-23
  Filled 2020-07-21: qty 354, 1d supply, fill #0

## 2020-07-21 MED FILL — Allopurinol Tab 300 MG: ORAL | 30 days supply | Qty: 30 | Fill #1 | Status: AC

## 2020-07-21 MED FILL — Chlorthalidone Tab 25 MG: ORAL | 30 days supply | Qty: 30 | Fill #1 | Status: AC

## 2020-07-21 NOTE — Progress Notes (Signed)
Pt verified name, DOB, address and insurance during PV today. Pt mailed instruction packet to included paper to complete and mail back to [facility] with addressed and stamped envelope, Emmi video, copy of consent form to read and not return, and instructions. PV completed over the phone. Pt encouraged to call with questions or issues.  My Chart instructions to pt as well    No egg or soy allergy known to patient  No  issues with past sedation with any surgeries or procedures Patient had issues odifficulty with intubation  [facility] case  No FH of Malignant Hyperthermia No diet pills per patient No home 02 use per patient  No blood thinners per patient  Pt denies issues with constipation  No A fib or A flutter  EMMI video to pt or via [place] 19 guidelines implemented in [place] today with Pt and RN  Pt is fully vaccinated  for Covid   Due to the COVID-19 pandemic we are asking patients to follow certain guidelines.  Pt aware of COVID protocols and [facility] guidelines

## 2020-07-22 ENCOUNTER — Other Ambulatory Visit (HOSPITAL_COMMUNITY): Payer: Self-pay

## 2020-08-04 ENCOUNTER — Encounter: Payer: 59 | Admitting: Gastroenterology

## 2020-08-12 ENCOUNTER — Other Ambulatory Visit (HOSPITAL_COMMUNITY): Payer: 59

## 2020-08-18 ENCOUNTER — Other Ambulatory Visit: Payer: Self-pay | Admitting: Medical

## 2020-08-18 ENCOUNTER — Other Ambulatory Visit (HOSPITAL_COMMUNITY): Payer: Self-pay

## 2020-08-18 ENCOUNTER — Other Ambulatory Visit (HOSPITAL_COMMUNITY)
Admission: RE | Admit: 2020-08-18 | Discharge: 2020-08-18 | Disposition: A | Discharge status: Home / Self Care | Payer: 59 | Source: Ambulatory Visit | Attending: Gastroenterology | Admitting: Gastroenterology

## 2020-08-18 DIAGNOSIS — Z01812 Encounter for preprocedural laboratory examination: Secondary | ICD-10-CM | POA: Diagnosis present

## 2020-08-18 DIAGNOSIS — Z20822 Contact with and (suspected) exposure to covid-19: Secondary | ICD-10-CM | POA: Diagnosis not present

## 2020-08-18 LAB — SARS CORONAVIRUS 2 (TAT 6-24 HRS): SARS Coronavirus 2: NEGATIVE

## 2020-08-18 MED ORDER — METFORMIN HCL 500 MG PO TABS
ORAL_TABLET | ORAL | 0 refills | Status: DC
  Filled 2020-08-18: qty 60, 30d supply, fill #0

## 2020-08-18 MED FILL — Allopurinol Tab 300 MG: ORAL | 30 days supply | Qty: 30 | Fill #2 | Status: AC

## 2020-08-18 MED FILL — Chlorthalidone Tab 25 MG: ORAL | 30 days supply | Qty: 30 | Fill #2 | Status: AC

## 2020-08-21 ENCOUNTER — Ambulatory Visit (HOSPITAL_COMMUNITY): Payer: 59 | Admitting: Anesthesiology

## 2020-08-21 ENCOUNTER — Encounter (HOSPITAL_COMMUNITY): Payer: Self-pay | Admitting: Gastroenterology

## 2020-08-21 ENCOUNTER — Ambulatory Visit (HOSPITAL_COMMUNITY)
Admission: RE | Admit: 2020-08-21 | Discharge: 2020-08-21 | Disposition: A | Discharge status: Home / Self Care | Payer: 59 | Attending: Gastroenterology | Admitting: Gastroenterology

## 2020-08-21 ENCOUNTER — Encounter (HOSPITAL_COMMUNITY)
Admission: RE | Disposition: A | Discharge status: Home / Self Care | Payer: Self-pay | Source: Home / Self Care | Attending: Gastroenterology

## 2020-08-21 ENCOUNTER — Other Ambulatory Visit: Payer: Self-pay

## 2020-08-21 DIAGNOSIS — Z85528 Personal history of other malignant neoplasm of kidney: Secondary | ICD-10-CM | POA: Insufficient documentation

## 2020-08-21 DIAGNOSIS — K5730 Diverticulosis of large intestine without perforation or abscess without bleeding: Secondary | ICD-10-CM | POA: Insufficient documentation

## 2020-08-21 DIAGNOSIS — K635 Polyp of colon: Secondary | ICD-10-CM

## 2020-08-21 DIAGNOSIS — R7303 Prediabetes: Secondary | ICD-10-CM | POA: Insufficient documentation

## 2020-08-21 DIAGNOSIS — D120 Benign neoplasm of cecum: Secondary | ICD-10-CM | POA: Insufficient documentation

## 2020-08-21 DIAGNOSIS — Z905 Acquired absence of kidney: Secondary | ICD-10-CM | POA: Insufficient documentation

## 2020-08-21 DIAGNOSIS — Z86010 Personal history of colonic polyps: Secondary | ICD-10-CM | POA: Diagnosis not present

## 2020-08-21 DIAGNOSIS — Z87891 Personal history of nicotine dependence: Secondary | ICD-10-CM | POA: Diagnosis not present

## 2020-08-21 DIAGNOSIS — G4730 Sleep apnea, unspecified: Secondary | ICD-10-CM | POA: Diagnosis not present

## 2020-08-21 DIAGNOSIS — Z888 Allergy status to other drugs, medicaments and biological substances status: Secondary | ICD-10-CM | POA: Insufficient documentation

## 2020-08-21 DIAGNOSIS — Z1211 Encounter for screening for malignant neoplasm of colon: Secondary | ICD-10-CM | POA: Diagnosis not present

## 2020-08-21 DIAGNOSIS — I10 Essential (primary) hypertension: Secondary | ICD-10-CM | POA: Diagnosis not present

## 2020-08-21 HISTORY — PX: POLYPECTOMY: SHX5525

## 2020-08-21 HISTORY — PX: COLONOSCOPY WITH PROPOFOL: SHX5780

## 2020-08-21 LAB — GLUCOSE, CAPILLARY: Glucose-Capillary: 117 mg/dL — ABNORMAL HIGH (ref 70–99)

## 2020-08-21 SURGERY — COLONOSCOPY WITH PROPOFOL
Anesthesia: Monitor Anesthesia Care

## 2020-08-21 MED ORDER — PROPOFOL 10 MG/ML IV BOLUS
INTRAVENOUS | Status: DC | PRN
  Administered 2020-08-21 (×5): 20 mg via INTRAVENOUS

## 2020-08-21 MED ORDER — PROPOFOL 500 MG/50ML IV EMUL
INTRAVENOUS | Status: AC
  Filled 2020-08-21: qty 50

## 2020-08-21 MED ORDER — PROPOFOL 500 MG/50ML IV EMUL
INTRAVENOUS | Status: DC | PRN
  Administered 2020-08-21: 125 ug/kg/min via INTRAVENOUS

## 2020-08-21 MED ORDER — SODIUM CHLORIDE 0.9 % IV SOLN
INTRAVENOUS | Status: DC
Start: 2020-08-21 — End: 2020-08-21

## 2020-08-21 MED ORDER — PROPOFOL 10 MG/ML IV BOLUS
INTRAVENOUS | Status: AC
  Filled 2020-08-21: qty 20

## 2020-08-21 MED ORDER — LACTATED RINGERS IV SOLN
INTRAVENOUS | Status: DC
  Administered 2020-08-21: 1000 mL via INTRAVENOUS

## 2020-08-21 MED ORDER — LIDOCAINE 2% (20 MG/ML) 5 ML SYRINGE
INTRAMUSCULAR | Status: DC | PRN
  Administered 2020-08-21: 100 mg via INTRAVENOUS

## 2020-08-21 SURGICAL SUPPLY — 22 items

## 2020-08-21 NOTE — Op Note (Signed)
[facility] Patient Name: Brian Sawyer Procedure Date: 08/21/2020 MRN: [id number] Attending MD: Milus Banister , MD Date of Birth: February 21, 1956 CSN: [id number] Age: 65 Admit Type: Outpatient Procedure:                Colonoscopy Indications:              High risk colon cancer surveillance: Personal                            history of colonic polyps Providers:                Milus Banister, MD, Dulcy Fanny, Cletis Athens, Technician, Danley Danker, CRNA Referring MD:              Medicines:                Monitored Anesthesia Care Complications:            No immediate complications. Estimated blood loss:                            None. Estimated Blood Loss:     Estimated blood loss: none. Procedure:                Pre-Anesthesia Assessment:                           - Prior to the procedure, a History and Physical                            was performed, and patient medications and                            allergies were reviewed. The patient's tolerance of                            previous anesthesia was also reviewed. The risks                            and benefits of the procedure and the sedation                            options and risks were discussed with the patient.                            All questions were answered, and informed consent                            was obtained. Prior Anticoagulants: The patient has                            taken no previous anticoagulant or antiplatelet                            agents. ASA Grade  Assessment: III - A patient with                            severe systemic disease. After reviewing the risks                            and benefits, the patient was deemed in                            satisfactory condition to undergo the procedure.                           After obtaining informed consent, the colonoscope                            was passed under direct  vision. Throughout the                            procedure, the patient's blood pressure, pulse, and                            oxygen saturations were monitored continuously. The                            CF-HQ190L ([id number]) Olympus colonoscope was                            introduced through the anus and advanced to the the                            cecum, identified by appendiceal orifice and                            ileocecal valve. The colonoscopy was performed                            without difficulty. The patient tolerated the                            procedure well. The quality of the bowel                            preparation was adequate. The ileocecal valve,                            appendiceal orifice, and rectum were photographed. Scope In: 8:25:34 AM Scope Out: 8:41:08 AM Scope Withdrawal Time: 0 hours 6 minutes 28 seconds  Total Procedure Duration: 0 hours 15 minutes 34 seconds  Findings:      A 5 mm polyp was found in the cecum. The polyp was sessile. The polyp       was removed with a cold snare. Resection and retrieval were complete.      Multiple small and large-mouthed diverticula were found in the left       colon.  The exam was otherwise without abnormality on direct and retroflexion       views. Impression:               - One 5 mm polyp in the cecum, removed with a cold                            snare. Resected and retrieved.                           - Diverticulosis in the left colon.                           - The examination was otherwise normal on direct                            and retroflexion views. Moderate Sedation:      Not Applicable - Patient had care per Anesthesia. Recommendation:           - Patient has a contact number available for                            emergencies. The signs and symptoms of potential                            delayed complications were discussed with the                            patient. Return  to normal activities tomorrow.                            Written discharge instructions were provided to the                            patient.                           - Resume previous diet.                           - Continue present medications.                           - Await pathology results. Procedure Code(s):        --- Professional ---                           [phone], Colonoscopy, flexible; with removal of                            tumor(s), polyp(s), or other lesion(s) by snare                            technique Diagnosis Code(s):        --- Professional ---  Z86.010, Personal history of colonic polyps                           K63.5, Polyp of colon                           K57.30, Diverticulosis of large intestine without                            perforation or abscess without bleeding CPT copyright 2019 American Medical Association. All rights reserved. The codes documented in this report are preliminary and upon coder review may  be revised to meet current compliance requirements. Milus Banister, MD 08/21/2020 8:48:42 AM This report has been signed electronically. Number of Addenda: 0

## 2020-08-21 NOTE — Anesthesia Postprocedure Evaluation (Signed)
Anesthesia Post Note  Patient: Brian Sawyer  Procedure(s) Performed: COLONOSCOPY WITH PROPOFOL POLYPECTOMY     Patient location during evaluation: Endoscopy Anesthesia Type: MAC Level of consciousness: awake and alert Pain management: pain level controlled Vital Signs Assessment: post-procedure vital signs reviewed and stable Respiratory status: spontaneous breathing, nonlabored ventilation, respiratory function stable and patient connected to nasal cannula oxygen Cardiovascular status: stable and blood pressure returned to baseline Postop Assessment: no apparent nausea or vomiting Anesthetic complications: no   No notable events documented.  Last Vitals:  Vitals:   08/21/20 0858 08/21/20 0900  BP:  139/84  Pulse:  82  Resp:  16  Temp: 36.5 C   SpO2:  97%    Last Pain:  Vitals:   08/21/20 0858  TempSrc: Oral  PainSc: 0-No pain                 Belenda Cruise P Hulbert Branscome

## 2020-08-21 NOTE — Transfer of Care (Signed)
Immediate Anesthesia Transfer of Care Note  Patient: Brian Sawyer  Procedure(s) Performed: COLONOSCOPY WITH PROPOFOL POLYPECTOMY  Patient Location: Endoscopy Unit  Anesthesia Type:MAC  Level of Consciousness: drowsy  Airway & Oxygen Therapy: Patient Spontanous Breathing and Patient connected to face mask oxygen  Post-op Assessment: Report given to RN and Post -op Vital signs reviewed and stable  Post vital signs: Reviewed and stable  Last Vitals:  Vitals Value Taken Time  BP    Temp    Pulse    Resp    SpO2      Last Pain:  Vitals:   08/21/20 0807  TempSrc: Oral  PainSc: 0-No pain         Complications: No notable events documented.

## 2020-08-21 NOTE — Anesthesia Preprocedure Evaluation (Signed)
Anesthesia Evaluation  Patient identified by MRN, date of birth, ID band Patient awake    Reviewed: Allergy & Precautions, NPO status , Patient's Chart, lab work & pertinent test results  History of Anesthesia Complications (+) DIFFICULT AIRWAY  Airway Mallampati: IV  TM Distance: >3 FB Neck ROM: Full    Dental  (+) Teeth Intact   Pulmonary sleep apnea and Continuous Positive Airway Pressure Ventilation , former smoker,    Pulmonary exam normal        Cardiovascular hypertension,  Rhythm:Regular Rate:Normal     Neuro/Psych negative neurological ROS  negative psych ROS   GI/Hepatic Neg liver ROS, GERD  ,diverticulosis   Endo/Other  diabetes, Type 2, Oral Hypoglycemic Agents  Renal/GU Renal disease     Musculoskeletal negative musculoskeletal ROS (+)   Abdominal (+) + obese,  Abdomen: soft.    Peds  Hematology negative hematology ROS (+)   Anesthesia Other Findings 04/17/03 [facility] MDA rec: awake or asleep fiberoptic intubation, kidney surgery  Reproductive/Obstetrics                            Anesthesia Physical Anesthesia Plan  ASA: 2  Anesthesia Plan: MAC   Post-op Pain Management:    Induction: Intravenous  PONV Risk Score and Plan: 1 and Propofol infusion  Airway Management Planned: Simple Face Mask, Natural Airway and Nasal Cannula  Additional Equipment: None  Intra-op Plan:   Post-operative Plan:   Informed Consent: I have reviewed the patients History and Physical, chart, labs and discussed the procedure including the risks, benefits and alternatives for the proposed anesthesia with the patient or authorized representative who has indicated his/her understanding and acceptance.     Dental advisory given  Plan Discussed with: CRNA  Anesthesia Plan Comments: (Lab Results      Component                Value               Date                      WBC                       5.6                 05/16/2020                HGB                      14.8                05/16/2020                HCT                      44.3                05/16/2020                MCV                      88.0                05/16/2020                PLT  177.0               05/16/2020          )        Anesthesia Quick Evaluation

## 2020-08-21 NOTE — H&P (Signed)
HPI: This is a man with history of adenomatous polyps  ROS: complete GI ROS as described in HPI, all other review negative.  Constitutional:  No unintentional weight loss   Past Medical History:  Diagnosis Date   Allergy    Cancer ([place])    Kidney cancer    Difficult intubation    on 04/17/03 [facility] MDA rec: awake or asleep fiberoptic intubation, kidney surgery   Diverticulosis    GERD (gastroesophageal reflux disease)    Gout    History of kidney cancer 2006   left    Hypertension    Kidney stones    right   Prediabetes    Sleep apnea    uses cpap     Past Surgical History:  Procedure Laterality Date   COLONOSCOPY     COLONOSCOPY WITH PROPOFOL N/A 07/04/2014   Procedure: COLONOSCOPY WITH PROPOFOL;  Surgeon: Milus Banister, MD;  Location: [facility];  Service: Endoscopy;  Laterality: N/A;   EAR CYST EXCISION N/A 05/14/2013   Procedure: 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;  Surgeon: Isac Caddy, DDS;  Location: [place];  Service: Oral Surgery;  Laterality: N/A;   EXCISION OF ORAL TUMOR     PARTIAL NEPHRECTOMY Left 2006   POLYPECTOMY     SURGERY SCROTAL / TESTICULAR  1980s   TOOTH EXTRACTION N/A 05/14/2013   Procedure: REMOVAL OF SUPERNUMERARY TOOTH NUMBER FIFTY-NINE WITH BONE GRAFTING;  Surgeon: Isac Caddy, DDS;  Location: [place];  Service: Oral Surgery;  Laterality: N/A;    Current Facility-Administered Medications  Medication Dose Route Frequency Provider Last Rate Last Admin   0.9 %  sodium chloride infusion   Intravenous Continuous Milus Banister, MD       lactated ringers infusion   Intravenous Continuous Milus Banister, MD        Allergies as of 07/03/2020 - Review Complete 05/16/2020  Allergen Reaction Noted   Zocor [simvastatin] Other (See Comments) 06/14/2016    Family History  Adopted: Yes    Social History   Socioeconomic History   Marital status: Married    Spouse name: Not on file    Number of children: Not on file   Years of education: Not on file   Highest education level: Not on file  Occupational History   Not on file  Tobacco Use   Smoking status: Former    Years: 4.00    Pack years: 0.00    Types: Cigarettes    Quit date: 04/07/1976    Years since quitting: 44.4   Smokeless tobacco: Never  Substance and Sexual Activity   Alcohol use: Yes    Alcohol/week: 0.0 standard drinks    Comment: rare   Drug use: No   Sexual activity: Not on file  Other Topics Concern   Not on file  Social History Narrative   Not on file   Social Determinants of Health   Financial Resource Strain: Not on file  Food Insecurity: Not on file  Transportation Needs: Not on file  Physical Activity: Not on file  Stress: Not on file  Social Connections: Not on file  Intimate Partner Violence: Not on file     Physical Exam:  Constitutional: generally well-appearing Psychiatric: alert and oriented x3 Abdomen: soft, nontender, nondistended, no obvious ascites, no peritoneal signs, normal bowel sounds No peripheral edema noted in lower extremities  Assessment and plan: 65 y.o. male with history of adenomatous polyps  Colonoscopyy today  Please see the "Patient Instructions" section for addition details about the plan.  Owens Loffler, MD [facility] 08/21/2020, 7:32 AM

## 2020-08-22 ENCOUNTER — Encounter (HOSPITAL_COMMUNITY): Payer: Self-pay | Admitting: Gastroenterology

## 2020-08-22 LAB — SURGICAL PATHOLOGY

## 2020-09-24 ENCOUNTER — Other Ambulatory Visit: Payer: Self-pay | Admitting: Medical

## 2020-09-24 ENCOUNTER — Other Ambulatory Visit (HOSPITAL_COMMUNITY): Payer: Self-pay

## 2020-09-24 MED ORDER — ROSUVASTATIN CALCIUM 20 MG PO TABS
ORAL_TABLET | Freq: Every day | ORAL | 2 refills | Status: DC
  Filled 2020-09-24: qty 30, 30d supply, fill #0
  Filled 2020-10-26: qty 30, 30d supply, fill #1
  Filled 2020-12-02: qty 30, 30d supply, fill #2

## 2020-09-24 MED ORDER — METFORMIN HCL 500 MG PO TABS
ORAL_TABLET | ORAL | 0 refills | Status: DC
  Filled 2020-09-24: qty 60, 30d supply, fill #0

## 2020-09-24 MED ORDER — ALLOPURINOL 300 MG PO TABS
ORAL_TABLET | Freq: Every day | ORAL | 3 refills | Status: DC
  Filled 2020-09-24: qty 30, 30d supply, fill #0
  Filled 2020-10-26: qty 30, 30d supply, fill #1
  Filled 2020-12-02: qty 30, 30d supply, fill #2
  Filled 2021-01-13: qty 30, 30d supply, fill #3

## 2020-09-24 MED FILL — Chlorthalidone Tab 25 MG: ORAL | 30 days supply | Qty: 30 | Fill #3 | Status: AC

## 2020-09-26 ENCOUNTER — Other Ambulatory Visit (HOSPITAL_COMMUNITY): Payer: Self-pay

## 2020-10-26 ENCOUNTER — Other Ambulatory Visit: Payer: Self-pay | Admitting: Medical

## 2020-10-26 MED FILL — Chlorthalidone Tab 25 MG: ORAL | 30 days supply | Qty: 30 | Fill #4 | Status: AC

## 2020-10-27 ENCOUNTER — Other Ambulatory Visit (HOSPITAL_COMMUNITY): Payer: Self-pay

## 2020-10-27 MED ORDER — METFORMIN HCL 500 MG PO TABS
ORAL_TABLET | ORAL | 0 refills | Status: DC
Start: 2020-10-27 — End: 2020-12-02
  Filled 2020-10-27: qty 60, 30d supply, fill #0

## 2020-11-06 ENCOUNTER — Telehealth: Payer: Self-pay | Admitting: Medical

## 2020-11-06 NOTE — Telephone Encounter (Signed)
Patient is out of towm in a lot of pain ( Still in [place]) but he is unable to get out of bed without assistance. He wants to know if Tradol & robaxin  ( Muscle relaxer) can be sent in for him to a local pharm..  PT is willing to come in for a visit once he gets back.  [place], [address]

## 2020-11-06 NOTE — Telephone Encounter (Signed)
It dos not look like these meds are on the current medication list. Are you okay with sending these?

## 2020-11-07 ENCOUNTER — Telehealth (INDEPENDENT_AMBULATORY_CARE_PROVIDER_SITE_OTHER): Payer: 59 | Admitting: Medical

## 2020-11-07 ENCOUNTER — Other Ambulatory Visit: Payer: Self-pay

## 2020-11-07 DIAGNOSIS — M545 Low back pain, unspecified: Secondary | ICD-10-CM

## 2020-11-07 DIAGNOSIS — G8929 Other chronic pain: Secondary | ICD-10-CM | POA: Diagnosis not present

## 2020-11-07 MED ORDER — TRAMADOL HCL 50 MG PO TABS: ORAL_TABLET | ORAL | 0 refills | Status: DC

## 2020-11-07 MED ORDER — METHOCARBAMOL 500 MG PO TABS: 500.0000 mg | ORAL_TABLET | Freq: Three times a day (TID) | ORAL | 0 refills | Status: AC | PRN

## 2020-11-07 NOTE — Patient Instructions (Addendum)
For back pain/sciatica type features (sever) rx tramadol, ibuprofen and robaxin.  Back stretches as tolerated.   If pain worsens or red flag signs/symptoms then ED evaluation.  Follow up next week or sooner if needed.  Back exercises as tolerated.  Back Exercises These exercises help to make your trunk and back strong. They also help to keep the lower back flexible. Doing these exercises can help to prevent back pain or lessen existing pain. If you have back pain, try to do these exercises 2-3 times each day or as told by your doctor. As you get better, do the exercises once each day. Repeat the exercises more often as told by your doctor. To stop back pain from coming back, do the exercises once each day, or as told by your doctor. Exercises Single knee to chest Do these steps 3-5 times in a row for each leg: Lie on your back on a firm bed or the floor with your legs stretched out. Bring one knee to your chest. Grab your knee or thigh with both hands and hold them it in place. Pull on your knee until you feel a gentle stretch in your lower back or buttocks. Keep doing the stretch for 10-30 seconds. Slowly let go of your leg and straighten it. Pelvic tilt Do these steps 5-10 times in a row: Lie on your back on a firm bed or the floor with your legs stretched out. Bend your knees so they point up to the ceiling. Your feet should be flat on the floor. Tighten your lower belly (abdomen) muscles to press your lower back against the floor. This will make your tailbone point up to the ceiling instead of pointing down to your feet or the floor. Stay in this position for 5-10 seconds while you gently tighten your muscles and breathe evenly. Cat-cow Do these steps until your lower back bends more easily: Get on your hands and knees on a firm surface. Keep your hands under your shoulders, and keep your knees under your hips. You may put padding under your knees. Let your head hang down toward  your chest. Tighten (contract) the muscles in your belly. Point your tailbone toward the floor so your lower back becomes rounded like the back of a cat. Stay in this position for 5 seconds. Slowly lift your head. Let the muscles of your belly relax. Point your tailbone up toward the ceiling so your back forms a sagging arch like the back of a cow. Stay in this position for 5 seconds.  Press-ups Do these steps 5-10 times in a row: Lie on your belly (face-down) on the floor. Place your hands near your head, about shoulder-width apart. While you keep your back relaxed and keep your hips on the floor, slowly straighten your arms to raise the top half of your body and lift your shoulders. Do not use your back muscles. You may change where you place your hands in order to make yourself more comfortable. Stay in this position for 5 seconds. Slowly return to lying flat on the floor.  Bridges Do these steps 10 times in a row: Lie on your back on a firm surface. Bend your knees so they point up to the ceiling. Your feet should be flat on the floor. Your arms should be flat at your sides, next to your body. Tighten your butt muscles and lift your butt off the floor until your waist is almost as high as your knees. If you do not feel  the muscles working in your butt and the back of your thighs, slide your feet 1-2 inches farther away from your butt. Stay in this position for 3-5 seconds. Slowly lower your butt to the floor, and let your butt muscles relax. If this exercise is too easy, try doing it with your arms crossed over yourchest. Belly crunches Do these steps 5-10 times in a row: Lie on your back on a firm bed or the floor with your legs stretched out. Bend your knees so they point up to the ceiling. Your feet should be flat on the floor. Cross your arms over your chest. Tip your chin a little bit toward your chest but do not bend your neck. Tighten your belly muscles and slowly raise your  chest just enough to lift your shoulder blades a tiny bit off of the floor. Avoid raising your body higher than that, because it can put too much stress on your low back. Slowly lower your chest and your head to the floor. Back lifts Do these steps 5-10 times in a row: Lie on your belly (face-down) with your arms at your sides, and rest your forehead on the floor. Tighten the muscles in your legs and your butt. Slowly lift your chest off of the floor while you keep your hips on the floor. Keep the back of your head in line with the curve in your back. Look at the floor while you do this. Stay in this position for 3-5 seconds. Slowly lower your chest and your face to the floor. Contact a doctor if: Your back pain gets a lot worse when you do an exercise. Your back pain does not get better 2 hours after you exercise. If you have any of these problems, stop doing the exercises. Do not do them again unless your doctor says it is okay. Get help right away if: You have sudden, very bad back pain. If this happens, stop doing the exercises. Do not do them again unless your doctor says it is okay. This information is not intended to replace advice given to you by your health care provider. Make sure you discuss any questions you have with your healthcare provider. Document Revised: 11/24/2017 Document Reviewed: 11/24/2017 Elsevier Patient Education  2022 [employer].

## 2020-11-07 NOTE — Telephone Encounter (Signed)
Pt had a mychart visit today with ES.

## 2020-11-07 NOTE — Progress Notes (Signed)
   Subjective:    Patient ID: Brian Sawyer, male    DOB: 1956-02-29, 65 y.o.   MRN: [id number]  HPI  Virtual Visit via Telephone Note  I connected with Brian Sawyer on 11/07/20 at  9:00 AM EDT by telephone and verified that I am speaking with the correct person using two identifiers.  Location: Patient: [place] at [facility]. Provider: office   I discussed the limitations, risks, security and privacy concerns of performing an evaluation and management service by telephone and the availability of in person appointments. I also discussed with the patient that there may be a patient responsible charge related to this service. The patient expressed understanding and agreed to proceed.   History of Present Illness: Pt has some back pain worse in sciatic area. Pt had back pain periodically in the past brief and self limited. Pt states pain moderate to severe when he walks. Trying heat and ice. Also has tried ibuprofen.   Pt states pain in rt si area and into buttox. When he moves some pain that radiates to lower leg.  On vacation and sleeping on soft matress and lifting heavy cooler may have caused pain.  Pt at [employer]. [facility]. [place].   Observations/Objective:(briefly saw pt at onset of visit but audio did not work at all?) General-no acute distress, pleasant, oriented. Lungs- on inspection lungs appear unlabored. Neck- no tracheal deviation or jvd on inspection. Neuro- gross motor function appears intact.    Assessment and Plan: For back pain/sciatica type features rx tramadol, ibuprofen and robaxin.  Back stretches as tolerated.   If pain worsens or red flag signs/symptoms then ED evaluation.  Follow up next week or sooner if needed.  Back exercises as tolerated.  [name], PA-C    Follow Up Instructions:    I discussed the assessment and treatment plan with the patient. The patient was provided an opportunity to ask questions and all were  answered. The patient agreed with the plan and demonstrated an understanding of the instructions.   The patient was advised to call back or seek an in-person evaluation if the symptoms worsen or if the condition fails to improve as anticipated.  Time spent with patient today was 22  minutes which consisted of chart revdiew, discussing diagnosis, work up treatment and documentation.    [name], PA-C   Review of Systems     Objective:   Physical Exam        Assessment & Plan:

## 2020-11-11 ENCOUNTER — Telehealth: Payer: Self-pay | Admitting: Medical

## 2020-11-11 NOTE — Telephone Encounter (Signed)
Pt was given this Rx for short term use when he was out of town last wee. Are you willing to refill?

## 2020-11-11 NOTE — Telephone Encounter (Signed)
Medication: traMADol (ULTRAM) 50 MG tablet  Has the patient contacted their pharmacy? No. (If no, request that the patient contact the pharmacy for the refill.) (If yes, when and what did the pharmacy advise?)  Pt wanted to see can he get another refill until his appointment on Thursday. Still having very bad pain   Preferred Pharmacy (with phone number or street name): [facility] [place]  [address] [place] [address]  Phone:  [phone]    Agent: Please be advised that RX refills may take up to 3 business days. We ask that you follow-up with your pharmacy.

## 2020-11-12 NOTE — Telephone Encounter (Signed)
Pt aware and voices understanding.  Pt scheduled already for 11/13/20 at 8:40 am.

## 2020-11-13 ENCOUNTER — Ambulatory Visit: Payer: 59 | Admitting: Medical

## 2020-11-13 ENCOUNTER — Other Ambulatory Visit: Payer: Self-pay

## 2020-11-13 ENCOUNTER — Other Ambulatory Visit (HOSPITAL_COMMUNITY): Payer: Self-pay

## 2020-11-13 ENCOUNTER — Ambulatory Visit (HOSPITAL_BASED_OUTPATIENT_CLINIC_OR_DEPARTMENT_OTHER)
Admission: RE | Admit: 2020-11-13 | Discharge: 2020-11-13 | Disposition: A | Discharge status: Home / Self Care | Payer: 59 | Source: Ambulatory Visit | Attending: Medical | Admitting: Medical

## 2020-11-13 VITALS — BP 136/70 | HR 65 | Temp 97.7°F | Resp 18 | Ht 75.0 in | Wt 239.0 lb

## 2020-11-13 DIAGNOSIS — I10 Essential (primary) hypertension: Secondary | ICD-10-CM

## 2020-11-13 DIAGNOSIS — M79609 Pain in unspecified limb: Secondary | ICD-10-CM

## 2020-11-13 DIAGNOSIS — E785 Hyperlipidemia, unspecified: Secondary | ICD-10-CM | POA: Diagnosis not present

## 2020-11-13 DIAGNOSIS — M109 Gout, unspecified: Secondary | ICD-10-CM

## 2020-11-13 DIAGNOSIS — E119 Type 2 diabetes mellitus without complications: Secondary | ICD-10-CM | POA: Diagnosis not present

## 2020-11-13 DIAGNOSIS — G8929 Other chronic pain: Secondary | ICD-10-CM | POA: Insufficient documentation

## 2020-11-13 DIAGNOSIS — M545 Low back pain, unspecified: Secondary | ICD-10-CM

## 2020-11-13 LAB — LIPID PANEL
Cholesterol: 146 mg/dL (ref 0–200)
HDL: 32.5 mg/dL — ABNORMAL LOW (ref >39.00)
LDL Cholesterol: 75 mg/dL (ref 0–99)
NonHDL: 113.67
Total CHOL/HDL Ratio: 4
Triglycerides: 195 mg/dL — ABNORMAL HIGH (ref 0.0–149.0)
VLDL: 39 mg/dL (ref 0.0–40.0)

## 2020-11-13 LAB — COMPREHENSIVE METABOLIC PANEL
ALT: 20 U/L (ref 0–53)
AST: 35 U/L (ref 0–37)
Albumin: 4.8 g/dL (ref 3.5–5.2)
Alkaline Phosphatase: 99 U/L (ref 39–117)
BUN: 16 mg/dL (ref 6–23)
CO2: 28 mEq/L (ref 19–32)
Calcium: 9.5 mg/dL (ref 8.4–10.5)
Chloride: 104 mEq/L (ref 96–112)
Creatinine, Ser: 0.84 mg/dL (ref 0.40–1.50)
GFR: 91.78 mL/min (ref >60.00)
Glucose, Bld: 118 mg/dL — ABNORMAL HIGH (ref 70–99)
Potassium: 4.4 mEq/L (ref 3.5–5.1)
Sodium: 141 mEq/L (ref 135–145)
Total Bilirubin: 1 mg/dL (ref 0.2–1.2)
Total Protein: 7.8 g/dL (ref 6.0–8.3)

## 2020-11-13 LAB — URIC ACID: Uric Acid, Serum: 5.9 mg/dL (ref 4.0–7.8)

## 2020-11-13 LAB — HEMOGLOBIN A1C: Hgb A1c MFr Bld: 6.8 % — ABNORMAL HIGH (ref 4.6–6.5)

## 2020-11-13 LAB — D-DIMER, QUANTITATIVE: D-Dimer, Quant: 0.63 mcg/mL FEU — ABNORMAL HIGH (ref <0.50)

## 2020-11-13 MED ORDER — TRAMADOL HCL 50 MG PO TABS
50.0000 mg | ORAL_TABLET | Freq: Four times a day (QID) | ORAL | 0 refills | Status: AC | PRN
  Filled 2020-11-13: qty 6, 2d supply, fill #0

## 2020-11-13 NOTE — Patient Instructions (Addendum)
For recent low back pain with severe radicular pain will get lumbar spine xray. Presently pain subsided but still some rt lower ext decreased sensation rt lateral calf and foot. Possible disc issue. Will need to follow and if pain returns can refer to sports med MD. May need mri in future. If pain returns can use low dose tylenol and ibuprofen combination. Can make 6 tab low dose tramadol to use in event severe pain returns.  For htn continue chlorthalidone.  For diabetes continue metformin. Eat low sugar diet. Get cmp and a1c.  For high cholesterol continue statin and get lipid panel today.  Stress with work. Glad to hear you are retiring soon.  Hx of gout. Get uric acid level.  For recent minimal rt poplteal area pain and some percieved dilated veins rt lower calf will get d dimer stat. If +/elevated will order [place] rt lower ext.  Follow up date to be determined after lab review and imaging review.

## 2020-11-13 NOTE — Progress Notes (Signed)
Subjective:    Patient ID: Brian Sawyer, male    DOB: 1956/01/27, 65 y.o.   MRN: [id number]  HPI  Pt in for follow up.  Pt had recent back pain on his vacation. See last VV note. I had prescribed tramadol and muscle relaxant pending today visit.  Pt summarizes hx of mild occasional pain in recent past. But he also notes on vacation was sleeping on very soft matress. He noticed that this has caused some back pain in the past. He states while on beach felt sharp si area pain with radiation. The rest of vacation had radiating pain when he stood up.   Yesterday pt indicates that pain seemed to improve. He did not need pain medication yesterday.  Pt did start some stretching exercises eventually as pain started to subside.    Hx of htn. Bp controlled today.Pt is on chlorthaldione.  Hx of  diabetes. Last a1c was 6.7. on metformin 500 mg bid.  Hx of hyperlipidemia. On crestor.   Review of Systems  Constitutional:  Negative for chills, fatigue and fever.  Respiratory:  Negative for cough, chest tightness, shortness of breath and wheezing.   Cardiovascular:  Negative for chest pain and palpitations.  Gastrointestinal:  Negative for abdominal pain.  Genitourinary:  Negative for dysuria.  Musculoskeletal:  Positive for back pain. Negative for myalgias.       Residual pain today.   Skin:  Negative for rash.  Neurological:  Negative for dizziness.       Lateral calf fels numb and slight foot numb.  Hematological:  Negative for adenopathy.  Psychiatric/Behavioral:  Negative for behavioral problems and dysphoric mood.     Past Medical History:  Diagnosis Date   Allergy    Cancer ([place])    Kidney cancer    Difficult intubation    on 04/17/03 [facility] MDA rec: awake or asleep fiberoptic intubation, kidney surgery   Diverticulosis    GERD (gastroesophageal reflux disease)    Gout    History of kidney cancer 2006   left    Hypertension    Kidney stones    right   Prediabetes     Sleep apnea    uses cpap      Social History   Socioeconomic History   Marital status: Married    Spouse name: Not on file   Number of children: Not on file   Years of education: Not on file   Highest education level: Not on file  Occupational History   Not on file  Tobacco Use   Smoking status: Former    Years: 4.00    Types: Cigarettes    Quit date: 04/07/1976    Years since quitting: 44.6   Smokeless tobacco: Never  Substance and Sexual Activity   Alcohol use: Yes    Alcohol/week: 0.0 standard drinks    Comment: rare   Drug use: No   Sexual activity: Not on file  Other Topics Concern   Not on file  Social History Narrative   Not on file   Social Determinants of Health   Financial Resource Strain: Not on file  Food Insecurity: Not on file  Transportation Needs: Not on file  Physical Activity: Not on file  Stress: Not on file  Social Connections: Not on file  Intimate Partner Violence: Not on file    Past Surgical History:  Procedure Laterality Date   COLONOSCOPY     COLONOSCOPY WITH PROPOFOL N/A 07/04/2014   Procedure: COLONOSCOPY  WITH PROPOFOL;  Surgeon: Milus Banister, MD;  Location: [facility];  Service: Endoscopy;  Laterality: N/A;   COLONOSCOPY WITH PROPOFOL N/A 08/21/2020   Procedure: COLONOSCOPY WITH PROPOFOL;  Surgeon: Milus Banister, MD;  Location: [facility];  Service: Endoscopy;  Laterality: N/A;   EAR CYST EXCISION N/A 05/14/2013   Procedure: 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;  Surgeon: Isac Caddy, DDS;  Location: [place];  Service: Oral Surgery;  Laterality: N/A;   EXCISION OF ORAL TUMOR     PARTIAL NEPHRECTOMY Left 2006   POLYPECTOMY     POLYPECTOMY  08/21/2020   Procedure: POLYPECTOMY;  Surgeon: Milus Banister, MD;  Location: [facility];  Service: Endoscopy;;   SURGERY SCROTAL / TESTICULAR  1980s   TOOTH EXTRACTION N/A 05/14/2013   Procedure: REMOVAL OF SUPERNUMERARY TOOTH NUMBER FIFTY-NINE  WITH BONE GRAFTING;  Surgeon: Isac Caddy, DDS;  Location: [place];  Service: Oral Surgery;  Laterality: N/A;    Family History  Adopted: Yes    Allergies  Allergen Reactions   Zocor [Simvastatin] Other (See Comments)    Patient states 'made him feel bad'    Current Outpatient Medications on File Prior to Visit  Medication Sig Dispense Refill   acetaminophen (TYLENOL) 500 MG tablet Take 500-1,000 mg by mouth every 6 (six) hours as needed (pain).     allopurinol (ZYLOPRIM) 300 MG tablet Take 1 tablet by mouth daily. 30 tablet 3   augmented betamethasone dipropionate (DIPROLENE-AF) 0.05 % ointment APPLY TO AFFECTED AREA TWICE DAILY. (Patient taking differently: Apply 1 application topically 2 (two) times daily as needed (skin irritation).) 15 g 2   chlorthalidone (HYGROTON) 25 MG tablet TAKE 1 TABLET BY MOUTH ONCE A DAY (Patient taking differently: Take 25 mg by mouth in the morning.) 30 tablet 5   fluticasone (FLONASE) 50 MCG/ACT nasal spray Place 2 sprays into both nostrils daily. (Patient taking differently: Place 2 sprays into both nostrils daily as needed for allergies.) 16 g 1   ibuprofen (ADVIL) 200 MG tablet Take 200-400 mg by mouth every 8 (eight) hours as needed (pain).     metFORMIN (GLUCOPHAGE) 500 MG tablet Take 1 tablet by mouth twice daily 60 tablet 0   methocarbamol (ROBAXIN) 500 MG tablet Take 1 tablet (500 mg total) by mouth every 8 (eight) hours as needed for muscle spasms. 30 tablet 0   rosuvastatin (CRESTOR) 20 MG tablet Take 1 tablet by mouth daily. 30 tablet 2   traMADol (ULTRAM) 50 MG tablet 1-2 tab every 6 hours as needed pain 16 tablet 0   No current facility-administered medications on file prior to visit.    BP 136/70 (BP Location: Left Arm, Patient Position: Sitting, Cuff Size: Normal)   Pulse 65   Temp 97.7 F (36.5 C) (Oral)   Resp 18   Ht '6\' 3"'$  (1.905 m)   Wt 239 lb (108.4 kg)   SpO2 97%   BMI 29.87 kg/m       Objective:   Physical  Exam  General Appearance- Not in acute distress.    Chest and Lung Exam Auscultation: Breath sounds:-Normal. Clear even and unlabored. Adventitious sounds:- No Adventitious sounds.  Cardiovascular Auscultation:Rythm - Regular, rate and rythm. Heart Sounds -Normal heart sounds.  Abdomen Inspection:-Inspection Normal.  Palpation/Perucssion: Palpation and Percussion of the abdomen reveal- Non Tender, No Rebound tenderness, No rigidity(Guarding) and No Palpable abdominal masses.  Liver:-Normal.  Spleen:- Normal.   Back No Mid lumbar spine  tenderness to palpation. Mild rt si tenderness to palpation.  No pain on straight leg lift. No pain on lateral movements and flexion/extension of the spine.  Lower ext neurologic  L5-S1 sensation intact bilaterally. Normal patellar reflexes bilaterally. No foot drop bilaterally.  Slight decreased senation rt lateral calf and foot. No foot drop.   Rt leg- some varicose veins. Mild popliteal area pain. Calf is not swollen.    Assessment & Plan:   Patient Instructions  For recent low back pain with severe radicular pain will get lumbar spine xray. Presently pain subsided but still some rt lower ext decreased sensation rt lateral calf and foot. Possible disc issue. Will need to follow and if pain returns can refer to sports med MD. May need mri in future. If pain returns can use low dose tylenol and ibuprofen combination. Can make 6 tab low dose tramadol to use in event severe pain returns.  For htn continue chlorthalidone.  For diabetes continue metformin. Eat low sugar diet. Get cmp and a1c.  For high cholesterol continue statin and get lipid panel today.  Stress with work. Glad to hear you are retiring soon.  Hx of gout. Get uric acid level.  Follow up date to be determined after lab review and imaging review.   [name], PA-C

## 2020-11-13 NOTE — Addendum Note (Signed)
Addended by: Anabel Halon on: 11/13/2020 02:57 PM   Modules accepted: Orders

## 2020-11-14 ENCOUNTER — Ambulatory Visit (HOSPITAL_BASED_OUTPATIENT_CLINIC_OR_DEPARTMENT_OTHER)
Admission: RE | Admit: 2020-11-14 | Discharge: 2020-11-14 | Disposition: A | Discharge status: Home / Self Care | Payer: 59 | Source: Ambulatory Visit | Attending: Medical | Admitting: Medical

## 2020-11-14 DIAGNOSIS — M79609 Pain in unspecified limb: Secondary | ICD-10-CM | POA: Insufficient documentation

## 2020-11-20 ENCOUNTER — Other Ambulatory Visit (HOSPITAL_COMMUNITY): Payer: Self-pay

## 2020-12-02 ENCOUNTER — Other Ambulatory Visit: Payer: Self-pay | Admitting: Medical

## 2020-12-03 ENCOUNTER — Other Ambulatory Visit (HOSPITAL_COMMUNITY): Payer: Self-pay

## 2020-12-03 MED ORDER — CHLORTHALIDONE 25 MG PO TABS
ORAL_TABLET | Freq: Every day | ORAL | 5 refills | Status: DC
  Filled 2020-12-03: qty 30, 30d supply, fill #0
  Filled 2021-01-13: qty 30, 30d supply, fill #1
  Filled 2021-01-27 – 2021-02-16 (×2): qty 30, 30d supply, fill #2
  Filled 2021-04-02: qty 30, 30d supply, fill #3
  Filled 2021-06-10: qty 30, 30d supply, fill #4

## 2020-12-03 MED ORDER — METFORMIN HCL 500 MG PO TABS
ORAL_TABLET | ORAL | 0 refills | Status: DC
Start: 2020-12-03 — End: 2021-01-13
  Filled 2020-12-03: qty 60, 30d supply, fill #0

## 2020-12-04 ENCOUNTER — Other Ambulatory Visit (HOSPITAL_COMMUNITY): Payer: Self-pay

## 2021-01-13 ENCOUNTER — Other Ambulatory Visit: Payer: Self-pay | Admitting: Medical

## 2021-01-14 ENCOUNTER — Other Ambulatory Visit (HOSPITAL_COMMUNITY): Payer: Self-pay

## 2021-01-14 MED ORDER — ROSUVASTATIN CALCIUM 20 MG PO TABS
ORAL_TABLET | Freq: Every day | ORAL | 2 refills | Status: DC
  Filled 2021-01-14: qty 30, 30d supply, fill #0
  Filled 2021-01-27 – 2021-02-16 (×2): qty 30, 30d supply, fill #1
  Filled 2021-04-02: qty 30, 30d supply, fill #2

## 2021-01-14 MED ORDER — METFORMIN HCL 500 MG PO TABS
ORAL_TABLET | ORAL | 0 refills | Status: DC
  Filled 2021-01-14: qty 60, 30d supply, fill #0

## 2021-01-27 ENCOUNTER — Other Ambulatory Visit: Payer: Self-pay | Admitting: Medical

## 2021-01-27 ENCOUNTER — Other Ambulatory Visit (HOSPITAL_COMMUNITY): Payer: Self-pay

## 2021-01-27 MED ORDER — METFORMIN HCL 500 MG PO TABS
ORAL_TABLET | ORAL | 0 refills | Status: DC
Start: 2021-01-27 — End: 2021-04-02
  Filled 2021-01-27: qty 60, fill #0
  Filled 2021-02-16: qty 60, 30d supply, fill #0

## 2021-01-27 MED ORDER — ALLOPURINOL 300 MG PO TABS
ORAL_TABLET | Freq: Every day | ORAL | 3 refills | Status: DC
  Filled 2021-01-27: qty 30, fill #0
  Filled 2021-02-16: qty 30, 30d supply, fill #0
  Filled 2021-04-02: qty 30, 30d supply, fill #1
  Filled 2021-05-28: qty 30, 30d supply, fill #2
  Filled 2021-07-04: qty 30, 30d supply, fill #3

## 2021-02-06 ENCOUNTER — Other Ambulatory Visit (HOSPITAL_COMMUNITY): Payer: Self-pay

## 2021-02-16 ENCOUNTER — Other Ambulatory Visit (HOSPITAL_COMMUNITY): Payer: Self-pay

## 2021-02-17 ENCOUNTER — Other Ambulatory Visit (HOSPITAL_COMMUNITY): Payer: Self-pay

## 2021-02-17 MED ORDER — MUPIROCIN 2 % EX OINT
TOPICAL_OINTMENT | CUTANEOUS | 0 refills | Status: AC
  Filled 2021-02-17: qty 22, 10d supply, fill #0

## 2021-04-02 ENCOUNTER — Other Ambulatory Visit: Payer: Self-pay | Admitting: Medical

## 2021-04-02 ENCOUNTER — Other Ambulatory Visit (HOSPITAL_COMMUNITY): Payer: Self-pay

## 2021-04-02 MED ORDER — METFORMIN HCL 500 MG PO TABS
ORAL_TABLET | ORAL | 0 refills | Status: DC
  Filled 2021-04-02: qty 60, 30d supply, fill #0

## 2021-04-04 ENCOUNTER — Other Ambulatory Visit (HOSPITAL_COMMUNITY): Payer: Self-pay

## 2021-04-08 ENCOUNTER — Other Ambulatory Visit (HOSPITAL_COMMUNITY): Payer: Self-pay

## 2021-04-13 ENCOUNTER — Other Ambulatory Visit (HOSPITAL_COMMUNITY): Payer: Self-pay

## 2021-04-30 ENCOUNTER — Other Ambulatory Visit (HOSPITAL_COMMUNITY): Payer: Self-pay

## 2021-05-01 ENCOUNTER — Other Ambulatory Visit (HOSPITAL_COMMUNITY): Payer: Self-pay

## 2021-05-01 ENCOUNTER — Other Ambulatory Visit: Payer: Self-pay

## 2021-05-01 MED ORDER — ROSUVASTATIN CALCIUM 20 MG PO TABS
ORAL_TABLET | Freq: Every day | ORAL | 2 refills | Status: DC
  Filled 2021-05-01: qty 30, 30d supply, fill #0
  Filled 2021-05-01: qty 90, 90d supply, fill #0
  Filled 2021-05-06: qty 30, 30d supply, fill #0
  Filled 2021-05-07: qty 90, 90d supply, fill #0

## 2021-05-01 MED ORDER — METFORMIN HCL 500 MG PO TABS
ORAL_TABLET | ORAL | 3 refills | Status: DC
  Filled 2021-05-01: qty 180, 90d supply, fill #0
  Filled 2021-05-01 – 2021-05-06 (×2): qty 60, 30d supply, fill #0
  Filled 2021-05-07: qty 180, 90d supply, fill #0

## 2021-05-04 ENCOUNTER — Other Ambulatory Visit (HOSPITAL_COMMUNITY): Payer: Self-pay

## 2021-05-06 ENCOUNTER — Other Ambulatory Visit (HOSPITAL_COMMUNITY): Payer: Self-pay

## 2021-05-07 ENCOUNTER — Other Ambulatory Visit (HOSPITAL_COMMUNITY): Payer: Self-pay

## 2021-05-11 ENCOUNTER — Other Ambulatory Visit (HOSPITAL_COMMUNITY): Payer: Self-pay

## 2021-05-25 ENCOUNTER — Other Ambulatory Visit (HOSPITAL_COMMUNITY): Payer: Self-pay

## 2021-05-28 ENCOUNTER — Other Ambulatory Visit (HOSPITAL_COMMUNITY): Payer: Self-pay

## 2021-06-01 ENCOUNTER — Ambulatory Visit (INDEPENDENT_AMBULATORY_CARE_PROVIDER_SITE_OTHER): Payer: Medicare Other | Admitting: Medical

## 2021-06-01 VITALS — BP 137/65 | HR 94 | Temp 98.3°F | Resp 18 | Ht 75.0 in | Wt 230.0 lb

## 2021-06-01 DIAGNOSIS — I10 Essential (primary) hypertension: Secondary | ICD-10-CM | POA: Diagnosis not present

## 2021-06-01 DIAGNOSIS — G8929 Other chronic pain: Secondary | ICD-10-CM | POA: Diagnosis not present

## 2021-06-01 DIAGNOSIS — E119 Type 2 diabetes mellitus without complications: Secondary | ICD-10-CM | POA: Diagnosis not present

## 2021-06-01 DIAGNOSIS — M545 Low back pain, unspecified: Secondary | ICD-10-CM

## 2021-06-01 NOTE — Progress Notes (Signed)
? ?Subjective:  ? ? Patient ID: Brian Sawyer, male    DOB: 12/11/1955, 66 y.o.   MRN: [id number] ? ?HPI ? ?Pt in for follow up. ? ?Pt update me that he is retired.  ? ?Pt last time in was in September.  ? ?He had back pain. He states with minimal activity back pain will flare. He demonstrates even lightly picking up sticks/yard pain can be difficult. ? ? ?Pt xray in September below. ? ?IMPRESSION: ?1. Mild to moderate lumbar degenerative disc disease and facet ?arthropathy at L4-5 and L5-S1. ?2. Right inferior pole renal calculus. ? ?Back in September he had more sciatic pain that ran down leg to popliteal area. See A/P on sept visit below. ? ? ?" ?Patient Instructions  ?For recent low back pain with severe radicular pain will get lumbar spine xray. Presently pain subsided but still some rt lower ext decreased sensation rt lateral calf and foot. Possible disc issue. Will need to follow and if pain returns can refer to sports med MD. May need mri in future. If pain returns can use low dose tylenol and ibuprofen combination. Can make 6 tab low dose tramadol to use in event severe pain returns." ? ?Pt wants to make sure nothing serious though recently pain managed but concern limits activities of daily living. ? ?Presently has not pain. ? ? ?Htn- bp well controlled. Pt has chorlthalidone.  ? ? ?Review of Systems  ?Constitutional:  Negative for chills, fatigue and fever.  ?HENT:  Negative for congestion, drooling and ear discharge.   ?Respiratory:  Negative for cough, chest tightness, shortness of breath and wheezing.   ?Cardiovascular:  Negative for chest pain and palpitations.  ?Gastrointestinal:  Negative for abdominal pain and constipation.  ?Genitourinary:  Negative for flank pain and frequency.  ?Musculoskeletal:  Positive for back pain.  ?Skin:  Negative for rash.  ?Neurological:  Negative for dizziness and numbness.  ?Hematological:  Negative for adenopathy. Does not bruise/bleed easily.   ?Psychiatric/Behavioral:  Negative for behavioral problems and decreased concentration.   ? ? ?Past Medical History:  ?Diagnosis Date  ? Allergy   ? Cancer [facility])   ? Kidney cancer   ? Difficult intubation   ? on 04/17/03 [facility] MDA rec: awake or asleep fiberoptic intubation, kidney surgery  ? Diverticulosis   ? GERD (gastroesophageal reflux disease)   ? Gout   ? History of kidney cancer 2006  ? left   ? Hypertension   ? Kidney stones   ? right  ? Prediabetes   ? Sleep apnea   ? uses cpap   ? ?  ?Social History  ? ?Socioeconomic History  ? Marital status: Married  ?  Spouse name: Not on file  ? Number of children: Not on file  ? Years of education: Not on file  ? Highest education level: Not on file  ?Occupational History  ? Not on file  ?Tobacco Use  ? Smoking status: Former  ?  Years: 4.00  ?  Types: Cigarettes  ?  Quit date: 04/07/1976  ?  Years since quitting: 45.1  ? Smokeless tobacco: Never  ?Substance and Sexual Activity  ? Alcohol use: Yes  ?  Alcohol/week: 0.0 standard drinks  ?  Comment: rare  ? Drug use: No  ? Sexual activity: Not on file  ?Other Topics Concern  ? Not on file  ?Social History Narrative  ? Not on file  ? ?Social Determinants of Health  ? ?Financial Resource Strain: Not  on file  ?Food Insecurity: Not on file  ?Transportation Needs: Not on file  ?Physical Activity: Not on file  ?Stress: Not on file  ?Social Connections: Not on file  ?Intimate Partner Violence: Not on file  ? ? ?Past Surgical History:  ?Procedure Laterality Date  ? COLONOSCOPY    ? COLONOSCOPY WITH PROPOFOL N/A 07/04/2014  ? Procedure: COLONOSCOPY WITH PROPOFOL;  Surgeon: Milus Banister, MD;  [facility]: [facility];  Service: Endoscopy;  Laterality: N/A;  ? COLONOSCOPY WITH PROPOFOL N/A 08/21/2020  ? Procedure: COLONOSCOPY WITH PROPOFOL;  Surgeon: Milus Banister, MD;  Location[facility];  Service: Endoscopy;  Laterality: N/A;  ? EAR CYST EXCISION N/A 05/14/2013  ? Procedure: ENUCLEATION AND CURETTAGE OF ODONTOGENIC CYST FROM  THE LEFT ANTERIOR MAXILLA;  Surgeon: Isac Caddy, DDS;  Location: [place];  Service: Oral Surgery;  Laterality: N/A;  ? EXCISION OF ORAL TUMOR    ? PARTIAL NEPHRECTOMY Left 2006  ? POLYPECTOMY    ? POLYPECTOMY  08/21/2020  ? Procedure: POLYPECTOMY;  Surgeon: Milus Banister, MD;  Location: [facility];  Service: Endoscopy;;  ? SURGERY SCROTAL / TESTICULAR  1980s  ? TOOTH EXTRACTION N/A 05/14/2013  ? Procedure: REMOVAL OF SUPERNUMERARY TOOTH NUMBER FIFTY-NINE WITH BONE GRAFTING;  Surgeon: Isac Caddy, DDS;  Location: [place];  Service: Oral Surgery;  Laterality: N/A;  ? ? ?Family History  ?Adopted: Yes  ? ? ?Allergies  ?Allergen Reactions  ? Zocor [Simvastatin] Other (See Comments)  ?  Patient states 'made him feel bad'  ? ? ?Current Outpatient Medications on File Prior to Visit  ?Medication Sig Dispense Refill  ? acetaminophen (TYLENOL) 500 MG tablet Take 500-1,000 mg by mouth every 6 (six) hours as needed (pain).    ? allopurinol (ZYLOPRIM) 300 MG tablet Take 1 tablet by mouth daily. 30 tablet 3  ? chlorthalidone (HYGROTON) 25 MG tablet TAKE 1 TABLET BY MOUTH ONCE A DAY 30 tablet 5  ? fluticasone (FLONASE) 50 MCG/ACT nasal spray Place 2 sprays into both nostrils daily. (Patient taking differently: Place 2 sprays into both nostrils daily as needed for allergies.) 16 g 1  ? ibuprofen (ADVIL) 200 MG tablet Take 200-400 mg by mouth every 8 (eight) hours as needed (pain).    ? metFORMIN (GLUCOPHAGE) 500 MG tablet Take 1 tablet by mouth twice daily 60 tablet 3  ? methocarbamol (ROBAXIN) 500 MG tablet Take 1 tablet (500 mg total) by mouth every 8 (eight) hours as needed for muscle spasms. 30 tablet 0  ? mupirocin ointment (BACTROBAN) 2 % Apply thin layer to affected areas of skin twice a day. 22 g 0  ? rosuvastatin (CRESTOR) 20 MG tablet Take 1 tablet by mouth daily. 30 tablet 2  ? ?No current facility-administered medications on file prior to visit.  ? ? ?BP 137/65   Pulse 94   Temp 98.3 ?F (36.8 ?C)    Resp 18   Ht '6\' 3"'$  (1.905 m)   Wt 230 lb (104.3 kg)   SpO2 95%   BMI 28.75 kg/m?  ?  ?   ?Objective:  ? Physical Exam ? ?General Appearance- Not in acute distress. ? ? ? ?Chest and Lung Exam ?Auscultation: ?Breath sounds:-Normal. Clear even and unlabored. ?Adventitious sounds:- No Adventitious sounds. ? ?Cardiovascular ?Auscultation:Rythm - Regular, rate and rythm. Heart Sounds -Normal heart sounds. ? ?Abdomen ?Inspection:-Inspection Normal.  ?Palpation/Perucssion: Palpation and Percussion of the abdomen reveal- Non Tender, No Rebound tenderness, No rigidity(Guarding) and No Palpable abdominal masses.  ?  Liver:-Normal.  ?Spleen:- Normal.  ? ?Back ?Mid  rt sided lumbar spine tenderness to palpation. Pain when bending over ?No pain on straight leg lift. ?No pain on lateral movements and flexion/extension of the spine. ? ?Lower ext neurologic ? ?L5-S1 sensation intact bilaterally. ?Normal patellar reflexes bilaterally. ?No foot drop bilaterally.  ? ? ?   ?Assessment & Plan:  ? ?Patient Instructions  ?For chronic intermittent back pain placed referral to [facility] at your request.  ? ?Would recommend back stretches and core strengthening exercises.  ? ?I gave you copy of xray to show your wife and orthopedist. ? ?Can try low dose tylenol and low dose ibuprofen combination.  ? ?For htn continue chlorthalidone. ? ?For gout. Continue allopurinol.  ? ?Diabetes- on metformin. Will get a1c and cmp today. ? ?Follow up date to be determined after lab review.  ? ? ?[name], PA-C  ?

## 2021-06-01 NOTE — Patient Instructions (Addendum)
For chronic intermittent back pain placed referral to [facility] at your request.  ? ?Would recommend back stretches and core strengthening exercises.  ? ?I gave you copy of xray to show your wife and orthopedist. ? ?Can try low dose tylenol and low dose ibuprofen combination.  ? ?For htn continue chlorthalidone. ? ?For gout. Continue allopurinol.  ? ?Diabetes- on metformin. Will get a1c and cmp today. ? ?Follow up date to be determined after lab review. ?

## 2021-06-02 LAB — COMPREHENSIVE METABOLIC PANEL
ALT: 23 U/L (ref 0–53)
AST: 34 U/L (ref 0–37)
Albumin: 5 g/dL (ref 3.5–5.2)
Alkaline Phosphatase: 94 U/L (ref 39–117)
BUN: 19 mg/dL (ref 6–23)
CO2: 27 mEq/L (ref 19–32)
Calcium: 10.3 mg/dL (ref 8.4–10.5)
Chloride: 101 mEq/L (ref 96–112)
Creatinine, Ser: 0.91 mg/dL (ref 0.40–1.50)
GFR: 88.36 mL/min (ref >60.00)
Glucose, Bld: 96 mg/dL (ref 70–99)
Potassium: 3.5 mEq/L (ref 3.5–5.1)
Sodium: 139 mEq/L (ref 135–145)
Total Bilirubin: 0.5 mg/dL (ref 0.2–1.2)
Total Protein: 8.2 g/dL (ref 6.0–8.3)

## 2021-06-02 LAB — HEMOGLOBIN A1C: Hgb A1c MFr Bld: 6.7 % — ABNORMAL HIGH (ref 4.6–6.5)

## 2021-06-10 ENCOUNTER — Other Ambulatory Visit (HOSPITAL_COMMUNITY): Payer: Self-pay

## 2021-06-23 ENCOUNTER — Other Ambulatory Visit (HOSPITAL_COMMUNITY): Payer: Self-pay

## 2021-06-25 ENCOUNTER — Other Ambulatory Visit (HOSPITAL_COMMUNITY): Payer: Self-pay

## 2021-06-25 MED ORDER — PREDNISONE 5 MG (21) PO TBPK
ORAL_TABLET | ORAL | 1 refills | Status: AC
  Filled 2021-06-25: qty 21, 6d supply, fill #0

## 2021-07-04 ENCOUNTER — Other Ambulatory Visit: Payer: Self-pay | Admitting: Medical

## 2021-07-04 ENCOUNTER — Other Ambulatory Visit (HOSPITAL_COMMUNITY): Payer: Self-pay

## 2021-07-06 ENCOUNTER — Other Ambulatory Visit (HOSPITAL_COMMUNITY): Payer: Self-pay

## 2021-07-06 MED ORDER — CHLORTHALIDONE 25 MG PO TABS
ORAL_TABLET | Freq: Every day | ORAL | 5 refills | Status: DC
  Filled 2021-07-06 – 2021-07-17 (×2): qty 90, 90d supply, fill #0
  Filled 2021-10-26: qty 90, 90d supply, fill #1

## 2021-07-06 MED ORDER — METFORMIN HCL 500 MG PO TABS
ORAL_TABLET | ORAL | 3 refills | Status: DC
  Filled 2021-07-06: qty 60, fill #0
  Filled 2021-10-05: qty 60, 30d supply, fill #0
  Filled 2021-11-09: qty 60, 30d supply, fill #1
  Filled 2021-12-08: qty 60, 30d supply, fill #2
  Filled 2022-01-11: qty 60, 30d supply, fill #3

## 2021-07-06 MED ORDER — ALLOPURINOL 300 MG PO TABS
ORAL_TABLET | Freq: Every day | ORAL | 3 refills | Status: DC
Start: 2021-07-06 — End: 2021-08-07

## 2021-07-06 MED ORDER — ROSUVASTATIN CALCIUM 20 MG PO TABS
ORAL_TABLET | Freq: Every day | ORAL | 2 refills | Status: DC
  Filled 2021-07-06: qty 30, fill #0
  Filled 2021-08-27: qty 30, 30d supply, fill #0

## 2021-07-15 ENCOUNTER — Other Ambulatory Visit (HOSPITAL_COMMUNITY): Payer: Self-pay

## 2021-07-17 ENCOUNTER — Other Ambulatory Visit (HOSPITAL_COMMUNITY): Payer: Self-pay

## 2021-07-21 ENCOUNTER — Other Ambulatory Visit (HOSPITAL_COMMUNITY): Payer: Self-pay

## 2021-08-07 ENCOUNTER — Other Ambulatory Visit (HOSPITAL_COMMUNITY): Payer: Self-pay

## 2021-08-07 ENCOUNTER — Other Ambulatory Visit: Payer: Self-pay | Admitting: Medical

## 2021-08-11 ENCOUNTER — Other Ambulatory Visit (HOSPITAL_COMMUNITY): Payer: Self-pay

## 2021-08-11 MED ORDER — ALLOPURINOL 300 MG PO TABS
ORAL_TABLET | Freq: Every day | ORAL | 3 refills | Status: DC
  Filled 2021-08-11: qty 30, 30d supply, fill #0
  Filled 2021-09-09: qty 30, 30d supply, fill #1
  Filled 2021-10-05: qty 30, 30d supply, fill #2
  Filled 2021-11-09: qty 30, 30d supply, fill #3

## 2021-08-12 ENCOUNTER — Other Ambulatory Visit (HOSPITAL_COMMUNITY): Payer: Self-pay

## 2021-08-26 ENCOUNTER — Other Ambulatory Visit (HOSPITAL_COMMUNITY): Payer: Self-pay

## 2021-08-27 ENCOUNTER — Other Ambulatory Visit: Payer: Self-pay | Admitting: Medical

## 2021-08-27 ENCOUNTER — Other Ambulatory Visit (HOSPITAL_COMMUNITY): Payer: Self-pay

## 2021-08-27 MED ORDER — ROSUVASTATIN CALCIUM 20 MG PO TABS
ORAL_TABLET | Freq: Every day | ORAL | 2 refills | Status: DC
  Filled 2021-09-01: qty 30, 30d supply, fill #0
  Filled 2021-10-05: qty 30, 30d supply, fill #1
  Filled 2021-10-26 – 2021-10-28 (×2): qty 30, 30d supply, fill #2

## 2021-08-31 ENCOUNTER — Other Ambulatory Visit (HOSPITAL_COMMUNITY): Payer: Self-pay

## 2021-09-01 ENCOUNTER — Other Ambulatory Visit (HOSPITAL_COMMUNITY): Payer: Self-pay

## 2021-09-03 NOTE — Progress Notes (Unsigned)
Subjective:   Brian Sawyer is a 66 y.o. male who presents for an Initial Medicare Annual Wellness Visit.  Review of Systems           Objective:    There were no vitals filed for this visit. There is no height or weight on file to calculate BMI.     08/21/2020    7:36 AM 10/05/2018    3:20 PM 01/06/2015    5:51 PM 07/04/2014    8:13 AM 06/28/2014    2:24 PM 05/09/2013    8:45 AM  Advanced Directives  Does Patient Have a Medical Advance Directive? No No No No No Patient does not have advance directive;Patient would like information  Would patient like information on creating a medical advance directive? No - Patient declined No - Patient declined  No - patient declined information No - patient declined information Advance directive packet given    Current Medications (verified) Outpatient Encounter Medications as of 09/04/2021  Medication Sig   acetaminophen (TYLENOL) 500 MG tablet Take 500-1,000 mg by mouth every 6 (six) hours as needed (pain).   allopurinol (ZYLOPRIM) 300 MG tablet Take 1 tablet by mouth daily.   chlorthalidone (HYGROTON) 25 MG tablet TAKE 1 TABLET BY MOUTH ONCE A DAY   fluticasone (FLONASE) 50 MCG/ACT nasal spray Place 2 sprays into both nostrils daily. (Patient taking differently: Place 2 sprays into both nostrils daily as needed for allergies.)   ibuprofen (ADVIL) 200 MG tablet Take 200-400 mg by mouth every 8 (eight) hours as needed (pain).   metFORMIN (GLUCOPHAGE) 500 MG tablet Take 1 tablet by mouth twice daily   methocarbamol (ROBAXIN) 500 MG tablet Take 1 tablet (500 mg total) by mouth every 8 (eight) hours as needed for muscle spasms.   mupirocin ointment (BACTROBAN) 2 % Apply thin layer to affected areas of skin twice a day.   predniSONE (STERAPRED UNI-PAK 21 TAB) 5 MG (21) TBPK tablet Use as directed on package.  Take with food.   rosuvastatin (CRESTOR) 20 MG tablet Take 1 tablet by mouth daily.   No facility-administered encounter medications on  file as of 09/04/2021.    Allergies (verified) Zocor [simvastatin]   History: Past Medical History:  Diagnosis Date   Allergy    Cancer ([place])    Kidney cancer    Difficult intubation    on 04/17/03 [facility] MDA rec: awake or asleep fiberoptic intubation, kidney surgery   Diverticulosis    GERD (gastroesophageal reflux disease)    Gout    History of kidney cancer 2006   left    Hypertension    Kidney stones    right   Prediabetes    Sleep apnea    uses cpap    Past Surgical History:  Procedure Laterality Date   COLONOSCOPY     COLONOSCOPY WITH PROPOFOL N/A 07/04/2014   Procedure: COLONOSCOPY WITH PROPOFOL;  Surgeon: Milus Banister, MD;  Location: [facility];  Service: Endoscopy;  Laterality: N/A;   COLONOSCOPY WITH PROPOFOL N/A 08/21/2020   Procedure: COLONOSCOPY WITH PROPOFOL;  Surgeon: Milus Banister, MD;  Location: [facility];  Service: Endoscopy;  Laterality: N/A;   EAR CYST EXCISION N/A 05/14/2013   Procedure: 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;  Surgeon: Isac Caddy, DDS;  Location: [place];  Service: Oral Surgery;  Laterality: N/A;   EXCISION OF ORAL TUMOR     PARTIAL NEPHRECTOMY Left 2006   POLYPECTOMY  POLYPECTOMY  08/21/2020   Procedure: POLYPECTOMY;  Surgeon: Milus Banister, MD;  Location: [facility];  Service: Endoscopy;;   SURGERY SCROTAL / TESTICULAR  1980s   TOOTH EXTRACTION N/A 05/14/2013   Procedure: REMOVAL OF SUPERNUMERARY TOOTH NUMBER FIFTY-NINE WITH BONE GRAFTING;  Surgeon: Isac Caddy, DDS;  Location: [place];  Service: Oral Surgery;  Laterality: N/A;   Family History  Adopted: Yes   Social History   Socioeconomic History   Marital status: Married    Spouse name: Not on file   Number of children: Not on file   Years of education: Not on file   Highest education level: Not on file  Occupational History   Not on file  Tobacco Use   Smoking status: Former    Years: 4.00    Types:  Cigarettes    Quit date: 04/07/1976    Years since quitting: 45.4   Smokeless tobacco: Never  Substance and Sexual Activity   Alcohol use: Yes    Alcohol/week: 0.0 standard drinks of alcohol    Comment: rare   Drug use: No   Sexual activity: Not on file  Other Topics Concern   Not on file  Social History Narrative   Not on file   Social Determinants of Health   Financial Resource Strain: Not on file  Food Insecurity: Not on file  Transportation Needs: Not on file  Physical Activity: Not on file  Stress: Not on file  Social Connections: Not on file    Tobacco Counseling Counseling given: Not Answered   Clinical Intake:                 Diabetic?***         Activities of Daily Living     No data to display          Patient Care Team: Saguier, Brian Sawyer as PCP - General (Physician Assistant)  Indicate any recent Medical Services you may have received from other than [facility] providers in the past year (date may be approximate).     Assessment:   This is a routine wellness examination for Brian Sawyer.  Hearing/Vision screen No results found.  Dietary issues and exercise activities discussed:     Goals Addressed   None    Depression Screen    11/13/2020    8:46 AM 05/16/2020    8:43 AM 02/27/2019    8:14 AM 10/05/2018    3:19 PM 06/10/2016    8:00 AM  PHQ 2/9 Scores  PHQ - 2 Score 0 0 0 0 0    Fall Risk    11/13/2020    8:46 AM 10/05/2018    3:19 PM 06/10/2016    8:00 AM  [place] in the past year? 0 0 Yes  Comment   Accident, once tripped over item  Number falls in past yr: 0  1  Injury with Fall? 0  No    FALL RISK PREVENTION PERTAINING TO THE HOME:  Any stairs in or around the home? {YES/NO:21197} If so, are there any without handrails? {YES/NO:21197} Home free of loose throw rugs in walkways, pet beds, electrical cords, etc? {YES/NO:21197} Adequate lighting in your home to reduce risk of falls? {YES/NO:21197}  ASSISTIVE  DEVICES UTILIZED TO PREVENT FALLS:  Life alert? {YES/NO:21197} Use of a cane, walker or w/c? {YES/NO:21197} Grab bars in the bathroom? {YES/NO:21197} Shower chair or bench in shower? {YES/NO:21197} Elevated toilet seat or a handicapped toilet? {YES/NO:21197}  TIMED UP  AND GO:  Was the test performed? {YES/NO:21197}.  Length of time to ambulate 10 feet: *** sec.   {Appearance of [id number]  Cognitive Function:        Immunizations Immunization History  Administered Date(s) Administered   Influenza Split 12/13/2016   Influenza-Unspecified 11/17/2015, 12/13/2017, 12/25/2018   Tdap 04/03/2014    {TDAP status:2101805}  {Flu Vaccine status:2101806}  {Pneumococcal vaccine status:2101807}  {Covid-19 vaccine status:2101808}  Qualifies for Shingles Vaccine? {YES/NO:21197}  Zostavax completed {YES/NO:21197}  {Shingrix Completed?:2101804}  Screening Tests Health Maintenance  Topic Date Due   COVID-19 Vaccine (1) Never done   Hepatitis C Screening  Never done   Zoster Vaccines- Shingrix (1 of 2) Never done   Pneumonia Vaccine 15+ Years old (1 - PCV) Never done   URINE MICROALBUMIN  05/16/2021   INFLUENZA VACCINE  10/13/2021   TETANUS/TDAP  04/03/2024   COLONOSCOPY (Pts 45-28yr Insurance coverage will need to be confirmed)  08/22/2030   HIV Screening  Completed   HPV VACCINES  Aged Out    Health Maintenance  Health Maintenance Due  Topic Date Due   COVID-19 Vaccine (1) Never done   Hepatitis C Screening  Never done   Zoster Vaccines- Shingrix (1 of 2) Never done   Pneumonia Vaccine 66 Years old (1 - PCV) Never done   URINE MICROALBUMIN  05/16/2021    {Colorectal cancer screening:2101809}  Lung Cancer Screening: (Low Dose CT Chest recommended if Age 222-80years, 30 pack-year currently smoking OR have quit w/in 15years.) {DOES NOT does:27190::"does not"} qualify.   Lung Cancer Screening Referral: ***  Additional Screening:  Hepatitis C Screening: {DOES  NOT does:27190::"does not"} qualify; Completed ***  Vision Screening: Recommended annual ophthalmology exams for early detection of glaucoma and other disorders of the eye. Is the patient up to date with their annual eye exam?  {YES/NO:21197} Who is the provider or what is the name of the office in which the patient attends annual eye exams? *** If pt is not established with a provider, would they like to be referred to a provider to establish care? {YES/NO:21197}.   Dental Screening: Recommended annual dental exams for proper oral hygiene  Community Resource Referral / Chronic Care Management: CRR required this visit?  {YES/NO:21197}  CCM required this visit?  {YES/NO:21197}     Plan:     I have personally reviewed and noted the following in the patient's chart:   Medical and social history Use of alcohol, tobacco or illicit drugs  Current medications and supplements including opioid prescriptions. {Opioid [phone]} Functional ability and status Nutritional status Physical activity Advanced directives List of other physicians Hospitalizations, surgeries, and ER visits in previous 12 months Vitals Screenings to include cognitive, depression, and falls Referrals and appointments  In addition, I have reviewed and discussed with patient certain preventive protocols, quality metrics, and best practice recommendations. A written personalized care plan for preventive services as well as general preventive health recommendations were provided to patient.     SDuard BradyChism, C[place]  09/03/2021   Nurse Notes: ***

## 2021-09-04 ENCOUNTER — Ambulatory Visit (INDEPENDENT_AMBULATORY_CARE_PROVIDER_SITE_OTHER): Payer: Medicare Other

## 2021-09-04 DIAGNOSIS — Z0000 Encounter for general adult medical examination without abnormal findings: Secondary | ICD-10-CM | POA: Diagnosis not present

## 2021-09-09 ENCOUNTER — Other Ambulatory Visit (HOSPITAL_COMMUNITY): Payer: Self-pay

## 2021-10-05 ENCOUNTER — Other Ambulatory Visit (HOSPITAL_COMMUNITY): Payer: Self-pay

## 2021-10-26 ENCOUNTER — Other Ambulatory Visit (HOSPITAL_COMMUNITY): Payer: Self-pay

## 2021-10-28 ENCOUNTER — Other Ambulatory Visit (HOSPITAL_COMMUNITY): Payer: Self-pay

## 2021-11-09 ENCOUNTER — Other Ambulatory Visit (HOSPITAL_COMMUNITY): Payer: Self-pay

## 2021-12-08 ENCOUNTER — Other Ambulatory Visit (HOSPITAL_COMMUNITY): Payer: Self-pay

## 2021-12-08 ENCOUNTER — Other Ambulatory Visit: Payer: Self-pay | Admitting: Medical

## 2021-12-08 MED ORDER — ALLOPURINOL 300 MG PO TABS
300.0000 mg | ORAL_TABLET | Freq: Every day | ORAL | 0 refills | Status: DC
  Filled 2021-12-08: qty 30, 30d supply, fill #0

## 2021-12-08 MED ORDER — ROSUVASTATIN CALCIUM 20 MG PO TABS
20.0000 mg | ORAL_TABLET | Freq: Every day | ORAL | 0 refills | Status: DC
  Filled 2021-12-08: qty 30, 30d supply, fill #0

## 2022-01-11 ENCOUNTER — Other Ambulatory Visit (HOSPITAL_COMMUNITY): Payer: Self-pay

## 2022-01-11 ENCOUNTER — Other Ambulatory Visit: Payer: Self-pay | Admitting: Medical

## 2022-01-11 MED ORDER — ROSUVASTATIN CALCIUM 20 MG PO TABS
20.0000 mg | ORAL_TABLET | Freq: Every day | ORAL | 0 refills | Status: DC
  Filled 2022-01-11: qty 30, 30d supply, fill #0

## 2022-01-11 MED ORDER — CHLORTHALIDONE 25 MG PO TABS
25.0000 mg | ORAL_TABLET | Freq: Every day | ORAL | 5 refills | Status: DC
  Filled 2022-01-11: qty 30, 30d supply, fill #0
  Filled 2022-02-16: qty 30, 30d supply, fill #1
  Filled 2022-04-19: qty 30, 30d supply, fill #2
  Filled 2022-05-23: qty 30, 30d supply, fill #3
  Filled 2022-06-21: qty 30, 30d supply, fill #4
  Filled 2022-07-28: qty 30, 30d supply, fill #5

## 2022-01-11 MED ORDER — ALLOPURINOL 300 MG PO TABS
300.0000 mg | ORAL_TABLET | Freq: Every day | ORAL | 0 refills | Status: DC
  Filled 2022-01-11: qty 30, 30d supply, fill #0

## 2022-02-03 ENCOUNTER — Encounter: Payer: Self-pay | Admitting: Medical

## 2022-02-03 ENCOUNTER — Other Ambulatory Visit (HOSPITAL_COMMUNITY): Payer: Self-pay

## 2022-02-03 ENCOUNTER — Ambulatory Visit (INDEPENDENT_AMBULATORY_CARE_PROVIDER_SITE_OTHER): Payer: Medicare Other | Admitting: Medical

## 2022-02-03 VITALS — BP 138/70 | HR 63 | Temp 98.0°F | Resp 18 | Ht 75.0 in | Wt 231.4 lb

## 2022-02-03 DIAGNOSIS — Z23 Encounter for immunization: Secondary | ICD-10-CM | POA: Diagnosis not present

## 2022-02-03 DIAGNOSIS — E119 Type 2 diabetes mellitus without complications: Secondary | ICD-10-CM

## 2022-02-03 DIAGNOSIS — E785 Hyperlipidemia, unspecified: Secondary | ICD-10-CM

## 2022-02-03 DIAGNOSIS — R350 Frequency of micturition: Secondary | ICD-10-CM | POA: Diagnosis not present

## 2022-02-03 DIAGNOSIS — I10 Essential (primary) hypertension: Secondary | ICD-10-CM

## 2022-02-03 DIAGNOSIS — M1000 Idiopathic gout, unspecified site: Secondary | ICD-10-CM

## 2022-02-03 LAB — COMPREHENSIVE METABOLIC PANEL
ALT: 23 U/L (ref 0–53)
AST: 34 U/L (ref 0–37)
Albumin: 5.2 g/dL (ref 3.5–5.2)
Alkaline Phosphatase: 96 U/L (ref 39–117)
BUN: 19 mg/dL (ref 6–23)
CO2: 31 mEq/L (ref 19–32)
Calcium: 9.8 mg/dL (ref 8.4–10.5)
Chloride: 100 mEq/L (ref 96–112)
Creatinine, Ser: 0.83 mg/dL (ref 0.40–1.50)
GFR: 91.32 mL/min (ref >60.00)
Glucose, Bld: 112 mg/dL — ABNORMAL HIGH (ref 70–99)
Potassium: 4 mEq/L (ref 3.5–5.1)
Sodium: 139 mEq/L (ref 135–145)
Total Bilirubin: 0.8 mg/dL (ref 0.2–1.2)
Total Protein: 8.1 g/dL (ref 6.0–8.3)

## 2022-02-03 LAB — LIPID PANEL
Cholesterol: 119 mg/dL (ref 0–200)
HDL: 39.3 mg/dL (ref >39.00)
LDL Cholesterol: 50 mg/dL (ref 0–99)
NonHDL: 80.02
Total CHOL/HDL Ratio: 3
Triglycerides: 151 mg/dL — ABNORMAL HIGH (ref 0.0–149.0)
VLDL: 30.2 mg/dL (ref 0.0–40.0)

## 2022-02-03 LAB — CBC WITH DIFFERENTIAL/PLATELET
Basophils Absolute: 0 10*3/uL (ref 0.0–0.1)
Basophils Relative: 0.3 % (ref 0.0–3.0)
Eosinophils Absolute: 0.2 10*3/uL (ref 0.0–0.7)
Eosinophils Relative: 4.4 % (ref 0.0–5.0)
HCT: 42.9 % (ref 39.0–52.0)
Hemoglobin: 14.4 g/dL (ref 13.0–17.0)
Lymphocytes Relative: 34.7 % (ref 12.0–46.0)
Lymphs Abs: 1.8 10*3/uL (ref 0.7–4.0)
MCHC: 33.7 g/dL (ref 30.0–36.0)
MCV: 88.6 fl (ref 78.0–100.0)
Monocytes Absolute: 0.4 10*3/uL (ref 0.1–1.0)
Monocytes Relative: 7.3 % (ref 3.0–12.0)
Neutro Abs: 2.7 10*3/uL (ref 1.4–7.7)
Neutrophils Relative %: 53.3 % (ref 43.0–77.0)
Platelets: 190 10*3/uL (ref 150.0–400.0)
RBC: 4.84 Mil/uL (ref 4.22–5.81)
RDW: 14.8 % (ref 11.5–15.5)
WBC: 5.1 10*3/uL (ref 4.0–10.5)

## 2022-02-03 LAB — PSA: PSA: 0.34 ng/mL (ref 0.10–4.00)

## 2022-02-03 LAB — HEMOGLOBIN A1C: Hgb A1c MFr Bld: 6.7 % — ABNORMAL HIGH (ref 4.6–6.5)

## 2022-02-03 LAB — URIC ACID: Uric Acid, Serum: 6.2 mg/dL (ref 4.0–7.8)

## 2022-02-03 MED ORDER — FLUOCINONIDE 0.05 % EX SOLN
CUTANEOUS | 11 refills | Status: AC
  Filled 2022-02-03: qty 60, 30d supply, fill #0
  Filled 2022-08-24: qty 60, 30d supply, fill #1

## 2022-02-03 MED ORDER — HYDROCORTISONE 2.5 % EX CREA
TOPICAL_CREAM | CUTANEOUS | 6 refills | Status: AC
  Filled 2022-02-03: qty 30, 15d supply, fill #0

## 2022-02-03 NOTE — Addendum Note (Signed)
Addended by: Jeronimo Greaves on: 02/03/2022 09:16 AM   Modules accepted: Orders

## 2022-02-03 NOTE — Patient Instructions (Addendum)
For yearly check up  today I have ordered cbc, cmp and  lipid panel.  Vaccine today flu vaccine. Can get pneumonia later thru pharmacy since declined today(woud recommend pcv 20). Same for shingrix.  Recommend exercise and healthy diet.  We will let you know lab results as they come in.  Follow up date appointment will be determined after lab review.    For diabetes will get A1c and urine microalbumin. Continue metformin.  For htn continue chlorthalidone.  For gout get uric acid and continue allopurinol.  For high cholesterol continue crestor 20 mg daily.   Slight frequent urination. Will get psa.

## 2022-02-03 NOTE — Progress Notes (Signed)
Subjective:    Patient ID: Brian Sawyer, male    DOB: [birth year], 66 y.o.   MRN: [id number]  HPI  Here for yearly exam. He is fasting.  Has retired since last saw him. Living at [facility]. Pt states he will be trying to get local care thru [facility].  Pt states he signed up for [employer]. Eating  some healthier. Some weight loss about 8 lbs purposeful.  Last colonoscopy June 2022.  Pt has not had shingrix.   Pt has not had flu vaccine.  Will get today. He declines pneumonia.   Htn hx- on chlorthalidone 25 mg daily.   Hx of gout. On allopurinol.    Hx of high cholesterol on crestor 20 mg daily.   Diabetes history and on metformin 500 mg po bid.    Review of Systems  Constitutional:  Negative for chills, fatigue and fever.  HENT:  Negative for congestion, ear pain and hearing loss.   Respiratory:  Negative for cough, chest tightness, shortness of breath and wheezing.   Cardiovascular:  Negative for chest pain and palpitations.  Gastrointestinal:  Negative for abdominal pain, blood in stool and nausea.  Genitourinary:  Positive for frequency. Negative for dysuria.  Musculoskeletal:  Negative for back pain, myalgias and neck stiffness.  Skin:  Negative for rash.  Neurological:  Negative for dizziness, speech difficulty, weakness and light-headedness.  Hematological:  Negative for adenopathy. Does not bruise/bleed easily.  Psychiatric/Behavioral:  Negative for behavioral problems and decreased concentration.     Past Medical History:  Diagnosis Date   Allergy    Cancer ([place])    Kidney cancer    Difficult intubation    on 04/17/03 [facility] MDA rec: awake or asleep fiberoptic intubation, kidney surgery   Diverticulosis    GERD (gastroesophageal reflux disease)    Gout    History of kidney cancer 2006   left    Hypertension    Kidney stones    right   Prediabetes    Sleep apnea    uses cpap      Social History   Socioeconomic History   Marital status:  Married    Spouse name: Not on file   Number of children: Not on file   Years of education: Not on file   Highest education level: Not on file  Occupational History   Not on file  Tobacco Use   Smoking status: Former    Years: 4.00    Types: Cigarettes    Quit date: 04/07/1976    Years since quitting: 45.8   Smokeless tobacco: Never  Substance and Sexual Activity   Alcohol use: Yes    Alcohol/week: 0.0 standard drinks of alcohol    Comment: rare   Drug use: No   Sexual activity: Not on file  Other Topics Concern   Not on file  Social History Narrative   Not on file   Social Determinants of Health   Financial Resource Strain: Low Risk  (09/04/2021)   Overall Financial Resource Strain (CARDIA)    Difficulty of Paying Living Expenses: Not hard at all  Food Insecurity: No Food Insecurity (09/04/2021)   Hunger Vital Sign    Worried About Running Out of Food in the Last Year: Never true    Ran Out of Food in the Last Year: Never true  Transportation Needs: No Transportation Needs (09/04/2021)   PRAPARE - Hydrologist (Medical): No    Lack of Transportation (  Non-Medical): No  Physical Activity: Sufficiently Active (09/04/2021)   Exercise Vital Sign    Days of Exercise per Week: 7 days    Minutes of Exercise per Session: 60 min  Stress: No Stress Concern Present (09/04/2021)   [place]    Feeling of Stress : Not at all  Social Connections: Moderately Integrated (09/04/2021)   Social Connection and Isolation Panel [NHANES]    Frequency of Communication with Friends and Family: More than three times a week    Frequency of Social Gatherings with Friends and Family: More than three times a week    Attends Religious Services: More than 4 times per year    Active Member of [employer] or Organizations: No    Attends Archivist Meetings: Never    Marital Status: Married  Human resources officer  Violence: Not At Risk (09/04/2021)   Humiliation, Afraid, Rape, and Kick questionnaire    Fear of Current or Ex-Partner: No    Emotionally Abused: No    Physically Abused: No    Sexually Abused: No    Past Surgical History:  Procedure Laterality Date   COLONOSCOPY     COLONOSCOPY WITH PROPOFOL N/A 07/04/2014   Procedure: COLONOSCOPY WITH PROPOFOL;  Surgeon: Milus Banister, MD;  [facility];  Service: Endoscopy;  Laterality: N/A;   COLONOSCOPY WITH PROPOFOL N/A 08/21/2020   Procedure: COLONOSCOPY WITH PROPOFOL;  Surgeon: Milus Banister, MD;  [facility];  Service: Endoscopy;  Laterality: N/A;   EAR CYST EXCISION N/A 05/14/2013   Procedure: 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;  Surgeon: Isac Caddy, DDS;  Location: [place];  Service: Oral Surgery;  Laterality: N/A;   EXCISION OF ORAL TUMOR     PARTIAL NEPHRECTOMY Left 2006   POLYPECTOMY     POLYPECTOMY  08/21/2020   Procedure: POLYPECTOMY;  Surgeon: Milus Banister, MD;  [facility];  Service: Endoscopy;;   SURGERY SCROTAL / TESTICULAR  1980s   TOOTH EXTRACTION N/A 05/14/2013   Procedure: REMOVAL OF SUPERNUMERARY TOOTH NUMBER FIFTY-NINE WITH BONE GRAFTING;  Surgeon: Isac Caddy, DDS;  Location: [place];  Service: Oral Surgery;  Laterality: N/A;    Family History  Adopted: Yes    Allergies  Allergen Reactions   Zocor [Simvastatin] Other (See Comments)    Patient states 'made him feel bad'    Current Outpatient Medications on File Prior to Visit  Medication Sig Dispense Refill   acetaminophen (TYLENOL) 500 MG tablet Take 500-1,000 mg by mouth every 6 (six) hours as needed (pain).     allopurinol (ZYLOPRIM) 300 MG tablet Take 1 tablet (300 mg total) by mouth daily. 30 tablet 0   chlorthalidone (HYGROTON) 25 MG tablet Take 1 tablet (25 mg total) by mouth daily. 30 tablet 5   fluticasone (FLONASE) 50 MCG/ACT nasal spray Place 2 sprays into both  nostrils daily. (Patient taking differently: Place 2 sprays into both nostrils daily as needed for allergies.) 16 g 1   ibuprofen (ADVIL) 200 MG tablet Take 200-400 mg by mouth every 8 (eight) hours as needed (pain).     metFORMIN (GLUCOPHAGE) 500 MG tablet Take 1 tablet by mouth twice daily 60 tablet 3   methocarbamol (ROBAXIN) 500 MG tablet Take 1 tablet (500 mg total) by mouth every 8 (eight) hours as needed for muscle spasms. 30 tablet 0   mupirocin ointment (BACTROBAN) 2 % Apply thin layer to affected  areas of skin twice a day. 22 g 0   predniSONE (STERAPRED UNI-PAK 21 TAB) 5 MG (21) TBPK tablet Use as directed on package.  Take with food. 21 tablet 1   rosuvastatin (CRESTOR) 20 MG tablet Take 1 tablet (20 mg total) by mouth daily. 30 tablet 0   No current facility-administered medications on file prior to visit.    BP 138/70   Pulse 63   Temp 98 F (36.7 C)   Resp 18   Ht '6\' 3"'$  (1.905 m)   Wt 231 lb 6.4 oz (105 kg)   SpO2 98%   BMI 28.92 kg/m        Objective:   Physical Exam  General Mental Status- Alert. General Appearance- Not in acute distress.   Skin General: Color- Normal Color. Moisture- Normal Moisture.  Neck Carotid Arteries- Normal color. Moisture- Normal Moisture. No carotid bruits. No JVD.  Chest and Lung Exam Auscultation: Breath Sounds:-Normal.  Cardiovascular Auscultation:Rythm- Regular. Murmurs & Other Heart Sounds:Auscultation of the heart reveals- No Murmurs.  Abdomen Inspection:-Inspeection Normal. Palpation/Percussion:Note:No mass. Palpation and Percussion of the abdomen reveal- Non Tender, Non Distended + BS, no rebound or guarding.  Neurologic Cranial Nerve exam:- CN III-XII intact(No nystagmus), symmetric smile. Strength:- 5/5 equal and symmetric strength both upper and lower extremities.       Assessment & Plan:   Patient Instructions  For yearly check up  today I have ordered cbc, cmp and  lipid panel.  Vaccine today flu  vaccine. Can get pneumonia later thru pharmacy since declined today. Same for shingrix.  Recommend exercise and healthy diet.  We will let you know lab results as they come in.  Follow up date appointment will be determined after lab review.    For diabetes will get A1c and urine microalbumin. Continue metformin.  For htn continue chlorthalidone.  For gout get uric acid and continue allopurinol.  For high cholesterol continue crestor 20 mg daily.   Slight frequent urination. Will get psa.         [name], PA-C

## 2022-02-04 LAB — MICROALBUMIN, URINE: Microalb, Ur: 2.5 mg/dL

## 2022-02-05 ENCOUNTER — Other Ambulatory Visit (HOSPITAL_COMMUNITY): Payer: Self-pay

## 2022-02-16 ENCOUNTER — Other Ambulatory Visit (HOSPITAL_COMMUNITY): Payer: Self-pay

## 2022-02-16 ENCOUNTER — Other Ambulatory Visit: Payer: Self-pay | Admitting: Medical

## 2022-02-16 MED ORDER — METFORMIN HCL 500 MG PO TABS
500.0000 mg | ORAL_TABLET | Freq: Two times a day (BID) | ORAL | 0 refills | Status: DC
  Filled 2022-02-16: qty 180, 90d supply, fill #0

## 2022-02-16 MED ORDER — ALLOPURINOL 300 MG PO TABS
300.0000 mg | ORAL_TABLET | Freq: Every day | ORAL | 0 refills | Status: DC
  Filled 2022-02-16: qty 90, 90d supply, fill #0

## 2022-02-16 MED ORDER — ROSUVASTATIN CALCIUM 20 MG PO TABS
20.0000 mg | ORAL_TABLET | Freq: Every day | ORAL | 0 refills | Status: DC
  Filled 2022-02-16: qty 90, 90d supply, fill #0

## 2022-02-17 ENCOUNTER — Encounter (HOSPITAL_COMMUNITY): Payer: Self-pay

## 2022-02-17 ENCOUNTER — Other Ambulatory Visit (HOSPITAL_COMMUNITY): Payer: Self-pay

## 2022-02-22 ENCOUNTER — Other Ambulatory Visit (HOSPITAL_COMMUNITY): Payer: Self-pay

## 2022-04-19 ENCOUNTER — Other Ambulatory Visit (HOSPITAL_COMMUNITY): Payer: Self-pay

## 2022-04-19 ENCOUNTER — Other Ambulatory Visit (HOSPITAL_BASED_OUTPATIENT_CLINIC_OR_DEPARTMENT_OTHER): Payer: Self-pay

## 2022-05-04 ENCOUNTER — Other Ambulatory Visit: Payer: Self-pay

## 2022-05-04 ENCOUNTER — Other Ambulatory Visit: Payer: Self-pay | Admitting: Medical

## 2022-05-04 ENCOUNTER — Other Ambulatory Visit (HOSPITAL_COMMUNITY): Payer: Self-pay

## 2022-05-04 MED ORDER — ALLOPURINOL 300 MG PO TABS
300.0000 mg | ORAL_TABLET | Freq: Every day | ORAL | 0 refills | Status: DC
  Filled 2022-05-04: qty 90, 90d supply, fill #0

## 2022-05-23 ENCOUNTER — Other Ambulatory Visit (HOSPITAL_COMMUNITY): Payer: Self-pay

## 2022-05-30 ENCOUNTER — Other Ambulatory Visit: Payer: Self-pay | Admitting: Medical

## 2022-05-31 ENCOUNTER — Other Ambulatory Visit (HOSPITAL_COMMUNITY): Payer: Self-pay

## 2022-05-31 ENCOUNTER — Other Ambulatory Visit: Payer: Self-pay

## 2022-05-31 MED ORDER — ROSUVASTATIN CALCIUM 20 MG PO TABS
20.0000 mg | ORAL_TABLET | Freq: Every day | ORAL | 0 refills | Status: DC
  Filled 2022-05-31: qty 90, 90d supply, fill #0

## 2022-06-09 ENCOUNTER — Other Ambulatory Visit: Payer: Self-pay | Admitting: Medical

## 2022-06-09 ENCOUNTER — Other Ambulatory Visit (HOSPITAL_COMMUNITY): Payer: Self-pay

## 2022-06-09 MED ORDER — METFORMIN HCL 500 MG PO TABS
500.0000 mg | ORAL_TABLET | Freq: Two times a day (BID) | ORAL | 0 refills | Status: DC
  Filled 2022-06-09: qty 180, 90d supply, fill #0

## 2022-06-10 ENCOUNTER — Other Ambulatory Visit: Payer: Self-pay

## 2022-06-21 ENCOUNTER — Other Ambulatory Visit (HOSPITAL_COMMUNITY): Payer: Self-pay

## 2022-07-28 ENCOUNTER — Other Ambulatory Visit (HOSPITAL_COMMUNITY): Payer: Self-pay

## 2022-08-04 ENCOUNTER — Other Ambulatory Visit: Payer: Self-pay | Admitting: Medical

## 2022-08-05 ENCOUNTER — Other Ambulatory Visit (HOSPITAL_COMMUNITY): Payer: Self-pay

## 2022-08-05 MED ORDER — ALLOPURINOL 300 MG PO TABS
300.0000 mg | ORAL_TABLET | Freq: Every day | ORAL | 0 refills | Status: DC
  Filled 2022-08-05: qty 90, 90d supply, fill #0

## 2022-08-24 ENCOUNTER — Other Ambulatory Visit: Payer: Self-pay | Admitting: Medical

## 2022-08-24 ENCOUNTER — Other Ambulatory Visit (HOSPITAL_COMMUNITY): Payer: Self-pay

## 2022-08-24 ENCOUNTER — Other Ambulatory Visit: Payer: Self-pay

## 2022-08-24 MED ORDER — CHLORTHALIDONE 25 MG PO TABS
25.0000 mg | ORAL_TABLET | Freq: Every day | ORAL | 5 refills | Status: DC
  Filled 2022-08-24: qty 30, 30d supply, fill #0
  Filled 2022-09-28: qty 30, 30d supply, fill #1
  Filled 2022-11-05: qty 30, 30d supply, fill #2
  Filled 2022-12-10: qty 30, 30d supply, fill #3
  Filled 2023-01-07: qty 30, 30d supply, fill #4
  Filled 2023-02-16: qty 30, 30d supply, fill #5

## 2022-08-24 MED ORDER — ROSUVASTATIN CALCIUM 20 MG PO TABS
20.0000 mg | ORAL_TABLET | Freq: Every day | ORAL | 0 refills | Status: DC
  Filled 2022-08-24: qty 90, 90d supply, fill #0

## 2022-09-28 ENCOUNTER — Other Ambulatory Visit: Payer: Self-pay

## 2022-09-28 ENCOUNTER — Other Ambulatory Visit (HOSPITAL_COMMUNITY): Payer: Self-pay

## 2022-09-28 ENCOUNTER — Other Ambulatory Visit: Payer: Self-pay | Admitting: Medical

## 2022-09-28 MED ORDER — METFORMIN HCL 500 MG PO TABS
500.0000 mg | ORAL_TABLET | Freq: Two times a day (BID) | ORAL | 0 refills | Status: DC
  Filled 2022-09-28: qty 180, 90d supply, fill #0

## 2022-11-05 ENCOUNTER — Other Ambulatory Visit: Payer: Self-pay

## 2022-11-05 ENCOUNTER — Other Ambulatory Visit (HOSPITAL_COMMUNITY): Payer: Self-pay

## 2022-11-05 ENCOUNTER — Other Ambulatory Visit: Payer: Self-pay | Admitting: Medical

## 2022-11-05 MED ORDER — ALLOPURINOL 300 MG PO TABS
300.0000 mg | ORAL_TABLET | Freq: Every day | ORAL | 0 refills | Status: DC
  Filled 2022-11-05: qty 30, 30d supply, fill #0

## 2022-11-10 ENCOUNTER — Ambulatory Visit (INDEPENDENT_AMBULATORY_CARE_PROVIDER_SITE_OTHER): Payer: Medicare Other | Admitting: *Deleted

## 2022-11-10 DIAGNOSIS — Z0000 Encounter for general adult medical examination without abnormal findings: Secondary | ICD-10-CM

## 2022-11-10 NOTE — Progress Notes (Signed)
Subjective:   Brian Sawyer is a 67 y.o. male who presents for Medicare Annual/Subsequent preventive examination.  Visit Complete: Virtual  I connected with  Brian Sawyer on 11/10/22 by a audio enabled telemedicine application and verified that I am speaking with the correct person using two identifiers.  Patient Location: Home  Provider Location: Office/Clinic  I discussed the limitations of evaluation and management by telemedicine. The patient expressed understanding and agreed to proceed.    Review of Systems     Cardiac Risk Factors include: advanced age (>49men, >9 women);diabetes mellitus;dyslipidemia;male gender     Objective:    Vital Signs: Unable to obtain new vitals due to this being a telehealth visit.      11/10/2022    8:32 AM 09/04/2021    1:03 PM 08/21/2020    7:36 AM 10/05/2018    3:20 PM 01/06/2015    5:51 PM 07/04/2014    8:13 AM 06/28/2014    2:24 PM  Advanced Directives  Does Patient Have a Medical Advance Directive? No No No No No No No  Would patient like information on creating a medical advance directive? No - Patient declined No - Patient declined No - Patient declined No - Patient declined  No - patient declined information No - patient declined information    Current Medications (verified) Outpatient Encounter Medications as of 11/10/2022  Medication Sig   acetaminophen (TYLENOL) 500 MG tablet Take 500-1,000 mg by mouth every 6 (six) hours as needed (pain).   allopurinol (ZYLOPRIM) 300 MG tablet Take 1 tablet (300 mg total) by mouth daily.   chlorthalidone (HYGROTON) 25 MG tablet Take 1 tablet (25 mg total) by mouth daily.   fluocinonide (LIDEX) 0.05 % external solution Apply a small amount to affected areas of scalp nightly as needed for itching.   fluticasone (FLONASE) 50 MCG/ACT nasal spray Place 2 sprays into both nostrils daily. (Patient taking differently: Place 2 sprays into both nostrils daily as needed for allergies.)    hydrocortisone 2.5 % cream Apply a small amount to affected area twice daily when flaring   ibuprofen (ADVIL) 200 MG tablet Take 200-400 mg by mouth every 8 (eight) hours as needed (pain).   metFORMIN (GLUCOPHAGE) 500 MG tablet Take 1 tablet (500 mg total) by mouth 2 (two) times daily.   methocarbamol (ROBAXIN) 500 MG tablet Take 1 tablet (500 mg total) by mouth every 8 (eight) hours as needed for muscle spasms.   mupirocin ointment (BACTROBAN) 2 % Apply thin layer to affected areas of skin twice a day.   predniSONE (STERAPRED UNI-PAK 21 TAB) 5 MG (21) TBPK tablet Use as directed on package.  Take with food.   rosuvastatin (CRESTOR) 20 MG tablet Take 1 tablet (20 mg total) by mouth daily.   No facility-administered encounter medications on file as of 11/10/2022.    Allergies (verified) Zocor [simvastatin]   History: Past Medical History:  Diagnosis Date   Allergy    Cancer (HCC)    Kidney cancer    Difficult intubation    on 04/17/03 [facility] MDA rec: awake or asleep fiberoptic intubation, kidney surgery   Diverticulosis    GERD (gastroesophageal reflux disease)    Gout    History of kidney cancer 2006   left    Hypertension    Kidney stones    right   Prediabetes    Sleep apnea    uses cpap    Past Surgical History:  Procedure Laterality Date  COLONOSCOPY     COLONOSCOPY WITH PROPOFOL N/A 07/04/2014   Procedure: COLONOSCOPY WITH PROPOFOL;  Surgeon: Rachael Fee, MD;  Location: [facility];  Service: Endoscopy;  Laterality: N/A;   COLONOSCOPY WITH PROPOFOL N/A 08/21/2020   Procedure: COLONOSCOPY WITH PROPOFOL;  Surgeon: Rachael Fee, MD;  Location: [facility];  Service: Endoscopy;  Laterality: N/A;   EAR CYST EXCISION N/A 05/14/2013   Procedure: 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;  Surgeon: Francene Finders, DDS;  Location: [facility] OR;  Service: Oral Surgery;  Laterality: N/A;   EXCISION OF ORAL TUMOR     PARTIAL NEPHRECTOMY Left  2006   POLYPECTOMY     POLYPECTOMY  08/21/2020   Procedure: POLYPECTOMY;  Surgeon: Rachael Fee, MD;  Location: [facility];  Service: Endoscopy;;   SURGERY SCROTAL / TESTICULAR  1980s   TOOTH EXTRACTION N/A 05/14/2013   Procedure: REMOVAL OF SUPERNUMERARY TOOTH NUMBER FIFTY-NINE WITH BONE GRAFTING;  Surgeon: Francene Finders, DDS;  Location: [facility] OR;  Service: Oral Surgery;  Laterality: N/A;   Family History  Adopted: Yes   Social History   Socioeconomic History   Marital status: Married    Spouse name: Not on file   Number of children: Not on file   Years of education: Not on file   Highest education level: Not on file  Occupational History   Not on file  Tobacco Use   Smoking status: Former    Current packs/day: 0.00    Types: Cigarettes    Start date: 04/07/1972    Quit date: 04/07/1976    Years since quitting: 46.6   Smokeless tobacco: Never  Substance and Sexual Activity   Alcohol use: Yes    Alcohol/week: 0.0 standard drinks of alcohol    Comment: rare   Drug use: No   Sexual activity: Not on file  Other Topics Concern   Not on file  Social History Narrative   Not on file   Social Determinants of Health   Financial Resource Strain: Low Risk  (11/10/2022)   Overall Financial Resource Strain (CARDIA)    Difficulty of Paying Living Expenses: Not hard at all  Food Insecurity: No Food Insecurity (11/10/2022)   Hunger Vital Sign    Worried About Running Out of Food in the Last Year: Never true    Ran Out of Food in the Last Year: Never true  Transportation Needs: No Transportation Needs (11/10/2022)   PRAPARE - Administrator, Civil Service (Medical): No    Lack of Transportation (Non-Medical): No  Physical Activity: Inactive (11/10/2022)   Exercise Vital Sign    Days of Exercise per Week: 0 days    Minutes of Exercise per Session: 0 min  Stress: No Stress Concern Present (11/10/2022)   [employer] of Occupational Health - Occupational Stress  Questionnaire    Feeling of Stress : Not at all  Social Connections: Moderately Integrated (11/10/2022)   Social Connection and Isolation Panel [NHANES]    Frequency of Communication with Friends and Family: More than three times a week    Frequency of Social Gatherings with Friends and Family: More than three times a week    Attends Religious Services: 1 to 4 times per year    Active Member of [employer] or Organizations: No    Attends Banker Meetings: Never    Marital Status: Married    Tobacco Counseling Counseling given: Not Answered   Clinical Intake:  Pre-visit preparation completed: Yes  Pain : No/denies pain  Nutritional Risks: None Diabetes: Yes CBG done?: No Did pt. bring in CBG monitor from home?: No  How often do you need to have someone help you when you read instructions, pamphlets, or other written materials from your doctor or pharmacy?: 1 - Never  Interpreter Needed?: No  Information entered by :: Donne Anon, CMA   Activities of Daily Living    11/10/2022    8:21 AM  In your present state of health, do you have any difficulty performing the following activities:  Hearing? 0  Vision? 0  Difficulty concentrating or making decisions? 0  Walking or climbing stairs? 0  Dressing or bathing? 0  Doing errands, shopping? 0  Preparing Food and eating ? N  Using the Toilet? N  In the past six months, have you accidently leaked urine? N  Do you have problems with loss of bowel control? N  Managing your Medications? N  Managing your Finances? N  Housekeeping or managing your Housekeeping? N    Patient Care Team: Saguier, Kateri Mc as PCP - General (Physician Assistant)  Indicate any recent Medical Services you may have received from other than [facility] providers in the past year (date may be approximate).     Assessment:   This is a routine wellness examination for Edsel.  Hearing/Vision screen No results found.  Dietary issues and  exercise activities discussed:     Goals Addressed   None    Depression Screen    11/10/2022    8:30 AM 09/04/2021    1:03 PM 11/13/2020    8:46 AM 05/16/2020    8:43 AM 02/27/2019    8:14 AM 10/05/2018    3:19 PM 06/10/2016    8:00 AM  PHQ 2/9 Scores  PHQ - 2 Score 0 0 0 0 0 0 0    Fall Risk    11/10/2022    8:25 AM 09/04/2021    1:03 PM 11/13/2020    8:46 AM 10/05/2018    3:19 PM 06/10/2016    8:00 AM  Fall Risk   Falls in the past year? 0 0 0 0 Yes  Comment     Accident, once tripped over item  Number falls in past yr: 0 0 0  1  Injury with Fall? 0 0 0  No  Risk for fall due to : No Fall Risks History of fall(s)     Follow up Falls evaluation completed Falls evaluation completed       MEDICARE RISK AT HOME: Medicare Risk at Home Any stairs in or around the home?: Yes If so, are there any without handrails?: No Home free of loose throw rugs in walkways, pet beds, electrical cords, etc?: Yes Adequate lighting in your home to reduce risk of falls?: Yes Life alert?: No Use of a cane, walker or w/c?: No Grab bars in the bathroom?: Yes Shower chair or bench in shower?: Yes Elevated toilet seat or a handicapped toilet?: Yes (comfort height)  TIMED UP AND GO:  Was the test performed?  No    Cognitive Function:        11/10/2022    8:37 AM 09/04/2021    1:09 PM  6CIT Screen  What Year? 0 points 0 points  What month? 0 points 0 points  What time? 0 points 0 points  Count back from 20 0 points 0 points  Months in reverse 0 points 0 points  Repeat phrase 0  points 0 points  Total Score 0 points 0 points    Immunizations Immunization History  Administered Date(s) Administered   Fluad Quad(high Dose 65+) 02/03/2022   Influenza Split 12/13/2016   Influenza-Unspecified 11/17/2015, 12/13/2017, 12/25/2018   PFIZER(Purple Top)SARS-COV-2 Vaccination 06/08/2019, 07/03/2019, 04/18/2020   Tdap 04/03/2014    TDAP status: Up to date  Flu Vaccine status: Due, Education has  been provided regarding the importance of this vaccine. Advised may receive this vaccine at local pharmacy or Health Dept. Aware to provide a copy of the vaccination record if obtained from local pharmacy or Health Dept. Verbalized acceptance and understanding.  Pneumococcal vaccine status: Due, Education has been provided regarding the importance of this vaccine. Advised may receive this vaccine at local pharmacy or Health Dept. Aware to provide a copy of the vaccination record if obtained from local pharmacy or Health Dept. Verbalized acceptance and understanding.  Covid-19 vaccine status: Information provided on how to obtain vaccines.   Qualifies for Shingles Vaccine? Yes   Zostavax completed No   Shingrix Completed?: No.    Education has been provided regarding the importance of this vaccine. Patient has been advised to call insurance company to determine out of pocket expense if they have not yet received this vaccine. Advised may also receive vaccine at local pharmacy or Health Dept. Verbalized acceptance and understanding.  Screening Tests Health Maintenance  Topic Date Due   Diabetic kidney evaluation - Urine ACR  Never done   Hepatitis C Screening  Never done   Zoster Vaccines- Shingrix (1 of 2) Never done   Pneumonia Vaccine 23+ Years old (1 of 1 - PCV) Never done   COVID-19 Vaccine (4 - 2023-24 season) 11/13/2021   Medicare Annual Wellness (AWV)  09/05/2022   INFLUENZA VACCINE  10/14/2022   Diabetic kidney evaluation - eGFR measurement  02/04/2023   DTaP/Tdap/Td (2 - Td or Tdap) 04/03/2024   Colonoscopy  08/22/2030   HPV VACCINES  Aged Out    Health Maintenance  Health Maintenance Due  Topic Date Due   Diabetic kidney evaluation - Urine ACR  Never done   Hepatitis C Screening  Never done   Zoster Vaccines- Shingrix (1 of 2) Never done   Pneumonia Vaccine 55+ Years old (1 of 1 - PCV) Never done   COVID-19 Vaccine (4 - 2023-24 season) 11/13/2021   Medicare Annual Wellness  (AWV)  09/05/2022   INFLUENZA VACCINE  10/14/2022    Colorectal cancer screening: Type of screening: Colonoscopy. Completed 08/21/20. Repeat every 10 years  Lung Cancer Screening: (Low Dose CT Chest recommended if Age 47-80 years, 20 pack-year currently smoking OR have quit w/in 15years.) does not qualify.   Additional Screening:  Hepatitis C Screening: does qualify; Completed N/a  Vision Screening: Recommended annual ophthalmology exams for early detection of glaucoma and other disorders of the eye. Is the patient up to date with their annual eye exam?  No  Who is the provider or what is the name of the office in which the patient attends annual eye exams? Doesn't have new eye doctor since moving If pt is not established with a provider, would they like to be referred to a provider to establish care? No .   Dental Screening: Recommended annual dental exams for proper oral hygiene  Diabetic Foot Exam: Diabetic Foot Exam: Overdue, Pt has been advised about the importance in completing this exam. Pt is scheduled for diabetic foot exam on N/a.  Community Resource Referral / Chronic Care Management: CRR  required this visit?  No   CCM required this visit?  No     Plan:     I have personally reviewed and noted the following in the patient's chart:   Medical and social history Use of alcohol, tobacco or illicit drugs  Current medications and supplements including opioid prescriptions. Patient is not currently taking opioid prescriptions. Functional ability and status Nutritional status Physical activity Advanced directives List of other physicians Hospitalizations, surgeries, and ER visits in previous 12 months Vitals Screenings to include cognitive, depression, and falls Referrals and appointments  In addition, I have reviewed and discussed with patient certain preventive protocols, quality metrics, and best practice recommendations. A written personalized care plan for  preventive services as well as general preventive health recommendations were provided to patient.     Donne Anon, CMA   11/10/2022   After Visit Summary: (MyChart) Due to this being a telephonic visit, the after visit summary with patients personalized plan was offered to patient via MyChart   Nurse Notes: None

## 2022-11-10 NOTE — Patient Instructions (Signed)
[name] , Thank you for taking time to come for your Medicare Wellness Visit. I appreciate your ongoing commitment to your health goals. Please review the following plan we discussed and let me know if I can assist you in the future.   These are the goals we discussed:  Goals   None     This is a list of the screening recommended for you and due dates:  Health Maintenance  Topic Date Due   Yearly kidney health urinalysis for diabetes  Never done   Hepatitis C Screening  Never done   Zoster (Shingles) Vaccine (1 of 2) Never done   Pneumonia Vaccine (1 of 1 - PCV) Never done   COVID-19 Vaccine (4 - 2023-24 season) 11/13/2021   Flu Shot  10/14/2022   Yearly kidney function blood test for diabetes  02/04/2023   Medicare Annual Wellness Visit  11/10/2023   DTaP/Tdap/Td vaccine (2 - Td or Tdap) 04/03/2024   Colon Cancer Screening  08/22/2030   HPV Vaccine  Aged Out     Next appointment: Follow up in one year for your annual wellness visit.   Preventive Care 66 Years and Older, Male Preventive care refers to lifestyle choices and visits with your health care provider that can promote health and wellness. What does preventive care include? A yearly physical exam. This is also called an annual well check. Dental exams once or twice a year. Routine eye exams. Ask your health care provider how often you should have your eyes checked. Personal lifestyle choices, including: Daily care of your teeth and gums. Regular physical activity. Eating a healthy diet. Avoiding tobacco and drug use. Limiting alcohol use. Practicing safe sex. Taking low doses of aspirin every day. Taking vitamin and mineral supplements as recommended by your health care provider. What happens during an annual well check? The services and screenings done by your health care provider during your annual well check will depend on your age, overall health, lifestyle risk factors, and family history of  disease. Counseling  Your health care provider may ask you questions about your: Alcohol use. Tobacco use. Drug use. Emotional well-being. Home and relationship well-being. Sexual activity. Eating habits. History of falls. Memory and ability to understand (cognition). Work and work Astronomer. Screening  You may have the following tests or measurements: Height, weight, and BMI. Blood pressure. Lipid and cholesterol levels. These may be checked every 5 years, or more frequently if you are over 22 years old. Skin check. Lung cancer screening. You may have this screening every year starting at age 16 if you have a 30-pack-year history of smoking and currently smoke or have quit within the past 15 years. Fecal occult blood test (FOBT) of the stool. You may have this test every year starting at age 37. Flexible sigmoidoscopy or colonoscopy. You may have a sigmoidoscopy every 5 years or a colonoscopy every 10 years starting at age 57. Prostate cancer screening. Recommendations will vary depending on your family history and other risks. Hepatitis C blood test. Hepatitis B blood test. Sexually transmitted disease (STD) testing. Diabetes screening. This is done by checking your blood sugar (glucose) after you have not eaten for a while (fasting). You may have this done every 1-3 years. Abdominal aortic aneurysm (AAA) screening. You may need this if you are a current or former smoker. Osteoporosis. You may be screened starting at age 70 if you are at high risk. Talk with your health care provider about your test results, treatment  options, and if necessary, the need for more tests. Vaccines  Your health care provider may recommend certain vaccines, such as: Influenza vaccine. This is recommended every year. Tetanus, diphtheria, and acellular pertussis (Tdap, Td) vaccine. You may need a Td booster every 10 years. Zoster vaccine. You may need this after age 54. Pneumococcal 13-valent  conjugate (PCV13) vaccine. One dose is recommended after age 89. Pneumococcal polysaccharide (PPSV23) vaccine. One dose is recommended after age 27. Talk to your health care provider about which screenings and vaccines you need and how often you need them. This information is not intended to replace advice given to you by your health care provider. Make sure you discuss any questions you have with your health care provider. Document Released: 03/28/2015 Document Revised: 11/19/2015 Document Reviewed: 12/31/2014 Elsevier Interactive Patient Education  2017 [employer].  Fall Prevention in the Home Falls can cause injuries. They can happen to people of all ages. There are many things you can do to make your home safe and to help prevent falls. What can I do on the outside of my home? Regularly fix the edges of walkways and driveways and fix any cracks. Remove anything that might make you trip as you walk through a door, such as a raised step or threshold. Trim any bushes or trees on the path to your home. Use bright outdoor lighting. Clear any walking paths of anything that might make someone trip, such as rocks or tools. Regularly check to see if handrails are loose or broken. Make sure that both sides of any steps have handrails. Any raised decks and porches should have guardrails on the edges. Have any leaves, snow, or ice cleared regularly. Use sand or salt on walking paths during winter. Clean up any spills in your garage right away. This includes oil or grease spills. What can I do in the bathroom? Use night lights. Install grab bars by the toilet and in the tub and shower. Do not use towel bars as grab bars. Use non-skid mats or decals in the tub or shower. If you need to sit down in the shower, use a plastic, non-slip stool. Keep the floor dry. Clean up any water that spills on the floor as soon as it happens. Remove soap buildup in the tub or shower regularly. Attach bath mats  securely with double-sided non-slip rug tape. Do not have throw rugs and other things on the floor that can make you trip. What can I do in the bedroom? Use night lights. Make sure that you have a light by your bed that is easy to reach. Do not use any sheets or blankets that are too big for your bed. They should not hang down onto the floor. Have a firm chair that has side arms. You can use this for support while you get dressed. Do not have throw rugs and other things on the floor that can make you trip. What can I do in the kitchen? Clean up any spills right away. Avoid walking on wet floors. Keep items that you use a lot in easy-to-reach places. If you need to reach something above you, use a strong step stool that has a grab bar. Keep electrical cords out of the way. Do not use floor polish or wax that makes floors slippery. If you must use wax, use non-skid floor wax. Do not have throw rugs and other things on the floor that can make you trip. What can I do with my stairs? Do not  leave any items on the stairs. Make sure that there are handrails on both sides of the stairs and use them. Fix handrails that are broken or loose. Make sure that handrails are as long as the stairways. Check any carpeting to make sure that it is firmly attached to the stairs. Fix any carpet that is loose or worn. Avoid having throw rugs at the top or bottom of the stairs. If you do have throw rugs, attach them to the floor with carpet tape. Make sure that you have a light switch at the top of the stairs and the bottom of the stairs. If you do not have them, ask someone to add them for you. What else can I do to help prevent falls? Wear shoes that: Do not have high heels. Have rubber bottoms. Are comfortable and fit you well. Are closed at the toe. Do not wear sandals. If you use a stepladder: Make sure that it is fully opened. Do not climb a closed stepladder. Make sure that both sides of the stepladder  are locked into place. Ask someone to hold it for you, if possible. Clearly mark and make sure that you can see: Any grab bars or handrails. First and last steps. Where the edge of each step is. Use tools that help you move around (mobility aids) if they are needed. These include: Canes. Walkers. Scooters. Crutches. Turn on the lights when you go into a dark area. Replace any light bulbs as soon as they burn out. Set up your furniture so you have a clear path. Avoid moving your furniture around. If any of your floors are uneven, fix them. If there are any pets around you, be aware of where they are. Review your medicines with your doctor. Some medicines can make you feel dizzy. This can increase your chance of falling. Ask your doctor what other things that you can do to help prevent falls. This information is not intended to replace advice given to you by your health care provider. Make sure you discuss any questions you have with your health care provider. Document Released: 12/26/2008 Document Revised: 08/07/2015 Document Reviewed: 04/05/2014 Elsevier Interactive Patient Education  2017 [employer].

## 2022-12-10 ENCOUNTER — Other Ambulatory Visit: Payer: Self-pay | Admitting: Medical

## 2022-12-10 ENCOUNTER — Other Ambulatory Visit (HOSPITAL_COMMUNITY): Payer: Self-pay

## 2022-12-17 ENCOUNTER — Other Ambulatory Visit: Payer: Self-pay

## 2022-12-17 ENCOUNTER — Telehealth: Payer: Self-pay | Admitting: Medical

## 2022-12-17 ENCOUNTER — Other Ambulatory Visit (HOSPITAL_COMMUNITY): Payer: Self-pay

## 2022-12-17 MED ORDER — ROSUVASTATIN CALCIUM 20 MG PO TABS
20.0000 mg | ORAL_TABLET | Freq: Every day | ORAL | 0 refills | Status: DC
  Filled 2022-12-17: qty 90, 90d supply, fill #0

## 2022-12-17 MED ORDER — ALLOPURINOL 300 MG PO TABS
300.0000 mg | ORAL_TABLET | Freq: Every day | ORAL | 0 refills | Status: DC
  Filled 2022-12-17: qty 30, 30d supply, fill #0

## 2022-12-17 NOTE — Addendum Note (Signed)
Addended by: Maximino Sarin on: 12/17/2022 10:36 AM   Modules accepted: Orders

## 2022-12-17 NOTE — Telephone Encounter (Signed)
Rx sent 

## 2022-12-17 NOTE — Telephone Encounter (Signed)
Prescription Request  12/17/2022  Is this a "Controlled Substance" medicine? No  LOV: Visit date not found  What is the name of the medication or equipment?   allopurinol (ZYLOPRIM) 300 MG tablet rosuvastatin (CRESTOR) 20 MG tablet  Have you contacted your pharmacy to request a refill? No   Which pharmacy would you like this sent to?  [facility] [address] [place] [address] Phone: [phone] Fax: [phone]     Patient notified that their request is being sent to the clinical staff for review and that they should receive a response within 2 business days.   Please advise at [facility] [phone]

## 2023-01-07 ENCOUNTER — Other Ambulatory Visit: Payer: Self-pay | Admitting: Medical

## 2023-01-07 ENCOUNTER — Other Ambulatory Visit (HOSPITAL_COMMUNITY): Payer: Self-pay

## 2023-01-07 ENCOUNTER — Other Ambulatory Visit: Payer: Self-pay

## 2023-01-07 MED ORDER — METFORMIN HCL 500 MG PO TABS
500.0000 mg | ORAL_TABLET | Freq: Two times a day (BID) | ORAL | 0 refills | Status: DC
  Filled 2023-01-07: qty 180, 90d supply, fill #0

## 2023-01-07 MED ORDER — ALLOPURINOL 300 MG PO TABS
300.0000 mg | ORAL_TABLET | Freq: Every day | ORAL | 0 refills | Status: DC
  Filled 2023-01-07 – 2023-01-15 (×2): qty 30, 30d supply, fill #0

## 2023-01-15 ENCOUNTER — Other Ambulatory Visit (HOSPITAL_COMMUNITY): Payer: Self-pay

## 2023-02-16 ENCOUNTER — Other Ambulatory Visit (HOSPITAL_COMMUNITY): Payer: Self-pay

## 2023-02-16 ENCOUNTER — Other Ambulatory Visit: Payer: Self-pay | Admitting: Medical

## 2023-02-23 ENCOUNTER — Other Ambulatory Visit (HOSPITAL_COMMUNITY): Payer: Self-pay

## 2023-02-28 ENCOUNTER — Ambulatory Visit (INDEPENDENT_AMBULATORY_CARE_PROVIDER_SITE_OTHER): Payer: Medicare Other | Admitting: Medical

## 2023-02-28 ENCOUNTER — Encounter: Payer: Self-pay | Admitting: Medical

## 2023-02-28 VITALS — BP 138/80 | HR 73 | Resp 18 | Ht 75.0 in | Wt 231.0 lb

## 2023-02-28 DIAGNOSIS — M1000 Idiopathic gout, unspecified site: Secondary | ICD-10-CM | POA: Diagnosis not present

## 2023-02-28 DIAGNOSIS — E119 Type 2 diabetes mellitus without complications: Secondary | ICD-10-CM | POA: Diagnosis not present

## 2023-02-28 DIAGNOSIS — Z7984 Long term (current) use of oral hypoglycemic drugs: Secondary | ICD-10-CM

## 2023-02-28 DIAGNOSIS — R350 Frequency of micturition: Secondary | ICD-10-CM

## 2023-02-28 DIAGNOSIS — I10 Essential (primary) hypertension: Secondary | ICD-10-CM

## 2023-02-28 DIAGNOSIS — E785 Hyperlipidemia, unspecified: Secondary | ICD-10-CM | POA: Diagnosis not present

## 2023-02-28 LAB — LIPID PANEL
Cholesterol: 125 mg/dL (ref 0–200)
HDL: 43.1 mg/dL (ref >39.00)
LDL Cholesterol: 59 mg/dL (ref 0–99)
NonHDL: 81.51
Total CHOL/HDL Ratio: 3
Triglycerides: 112 mg/dL (ref 0.0–149.0)
VLDL: 22.4 mg/dL (ref 0.0–40.0)

## 2023-02-28 LAB — CBC WITH DIFFERENTIAL/PLATELET
Basophils Absolute: 0 10*3/uL (ref 0.0–0.1)
Basophils Relative: 0.2 % (ref 0.0–3.0)
Eosinophils Absolute: 0.2 10*3/uL (ref 0.0–0.7)
Eosinophils Relative: 3.1 % (ref 0.0–5.0)
HCT: 44 % (ref 39.0–52.0)
Hemoglobin: 14.7 g/dL (ref 13.0–17.0)
Lymphocytes Relative: 28.2 % (ref 12.0–46.0)
Lymphs Abs: 1.5 10*3/uL (ref 0.7–4.0)
MCHC: 33.5 g/dL (ref 30.0–36.0)
MCV: 90.2 fL (ref 78.0–100.0)
Monocytes Absolute: 0.4 10*3/uL (ref 0.1–1.0)
Monocytes Relative: 6.9 % (ref 3.0–12.0)
Neutro Abs: 3.3 10*3/uL (ref 1.4–7.7)
Neutrophils Relative %: 61.6 % (ref 43.0–77.0)
Platelets: 163 10*3/uL (ref 150.0–400.0)
RBC: 4.88 Mil/uL (ref 4.22–5.81)
RDW: 15.1 % (ref 11.5–15.5)
WBC: 5.4 10*3/uL (ref 4.0–10.5)

## 2023-02-28 LAB — COMPREHENSIVE METABOLIC PANEL
ALT: 20 U/L (ref 0–53)
AST: 31 U/L (ref 0–37)
Albumin: 5 g/dL (ref 3.5–5.2)
Alkaline Phosphatase: 93 U/L (ref 39–117)
BUN: 16 mg/dL (ref 6–23)
CO2: 30 meq/L (ref 19–32)
Calcium: 10 mg/dL (ref 8.4–10.5)
Chloride: 101 meq/L (ref 96–112)
Creatinine, Ser: 0.85 mg/dL (ref 0.40–1.50)
GFR: 89.99 mL/min (ref >60.00)
Glucose, Bld: 116 mg/dL — ABNORMAL HIGH (ref 70–99)
Potassium: 4.1 meq/L (ref 3.5–5.1)
Sodium: 140 meq/L (ref 135–145)
Total Bilirubin: 0.8 mg/dL (ref 0.2–1.2)
Total Protein: 8 g/dL (ref 6.0–8.3)

## 2023-02-28 LAB — MICROALBUMIN / CREATININE URINE RATIO
Creatinine,U: 108.6 mg/dL
Microalb Creat Ratio: 7.3 mg/g (ref 0.0–30.0)
Microalb, Ur: 7.9 mg/dL — ABNORMAL HIGH (ref 0.0–1.9)

## 2023-02-28 LAB — PSA: PSA: 0.55 ng/mL (ref 0.10–4.00)

## 2023-02-28 LAB — URIC ACID: Uric Acid, Serum: 6.9 mg/dL (ref 4.0–7.8)

## 2023-02-28 MED ORDER — ALLOPURINOL 300 MG PO TABS
300.0000 mg | ORAL_TABLET | Freq: Every day | ORAL | 1 refills | Status: DC
  Filled 2023-02-28: qty 90, 90d supply, fill #0
  Filled 2023-05-28: qty 90, 90d supply, fill #1

## 2023-02-28 MED ORDER — ROSUVASTATIN CALCIUM 20 MG PO TABS
20.0000 mg | ORAL_TABLET | Freq: Every day | ORAL | 1 refills | Status: DC
  Filled 2023-02-28 – 2023-03-01 (×2): qty 90, 90d supply, fill #0
  Filled 2023-06-10: qty 90, 90d supply, fill #1

## 2023-02-28 MED ORDER — CHLORTHALIDONE 25 MG PO TABS
25.0000 mg | ORAL_TABLET | Freq: Every day | ORAL | 1 refills | Status: DC
  Filled 2023-02-28 – 2023-03-14 (×3): qty 90, 90d supply, fill #0
  Filled 2023-06-10: qty 90, 90d supply, fill #1

## 2023-02-28 NOTE — Patient Instructions (Addendum)
For diabetes will get A1c and urine microalbumin. Continue metformin.   For htn continue chlorthalidone.   For gout get uric acid and continue allopurinol.   For high cholesterol continue crestor 20 mg daily.    Slight frequent urination. Will get psa.  Declined pcv 20 vaccine, flu vaccine and shingrix today.  Follow up  date to be determined after lab review.

## 2023-02-28 NOTE — Progress Notes (Signed)
Subjective:    Patient ID: Brian Sawyer, male    DOB: 1956-01-14, 67 y.o.   MRN: [id number]  HPI  Pt in for follow up. He states did not take any of his meds today.  Pt states he retired to [employer]. He did get appointment with pcp Apr 28, 2022 with [facility]. Took a long time to get scheduled.   Last visit with me.  Last seen for diabetes, htn, gout, frequent urination and high cholesterol.  He is walking a lot daily. He wants his dog at least a mile.  States he is eating moderate healthy. Admits not real strict diet. He has been working out 2-3 times a week.  Pt declines pcv 20  and flu vaccine.     Review of Systems  Constitutional:  Negative for chills, fatigue and fever.  HENT:  Negative for congestion and ear pain.   Respiratory:  Negative for cough, chest tightness, shortness of breath and wheezing.   Cardiovascular:  Negative for chest pain and palpitations.  Gastrointestinal:  Negative for abdominal pain.  Genitourinary:  Negative for dysuria, frequency and penile pain.  Musculoskeletal:  Negative for back pain and neck pain.  Skin:  Negative for rash.  Neurological:  Negative for dizziness, weakness and light-headedness.  Hematological:  Negative for adenopathy. Does not bruise/bleed easily.  Psychiatric/Behavioral:  Negative for behavioral problems, decreased concentration and suicidal ideas. The patient is not nervous/anxious.     Past Medical History:  Diagnosis Date   Allergy    Cancer (HCC)    Kidney cancer    Difficult intubation    on 04/17/03 [facility] MDA rec: awake or asleep fiberoptic intubation, kidney surgery   Diverticulosis    GERD (gastroesophageal reflux disease)    Gout    History of kidney cancer 2006   left    Hypertension    Kidney stones    right   Prediabetes    Sleep apnea    uses cpap      Social History   Socioeconomic History   Marital status: Married    Spouse name: Not on file   Number of children: Not on file   Years  of education: Not on file   Highest education level: Bachelor's degree (e.g., BA, AB, BS)  Occupational History   Not on file  Tobacco Use   Smoking status: Former    Current packs/day: 0.00    Types: Cigarettes    Start date: 04/07/1972    Quit date: 04/07/1976    Years since quitting: 46.9   Smokeless tobacco: Never  Substance and Sexual Activity   Alcohol use: Yes    Alcohol/week: 0.0 standard drinks of alcohol    Comment: rare   Drug use: No   Sexual activity: Not on file  Other Topics Concern   Not on file  Social History Narrative   Not on file   Social Drivers of Health   Financial Resource Strain: Low Risk  (02/24/2023)   Overall Financial Resource Strain (CARDIA)    Difficulty of Paying Living Expenses: Not hard at all  Food Insecurity: No Food Insecurity (02/24/2023)   Hunger Vital Sign    Worried About Running Out of Food in the Last Year: Never true    Ran Out of Food in the Last Year: Never true  Transportation Needs: No Transportation Needs (02/24/2023)   PRAPARE - Administrator, Civil Service (Medical): No    Lack of Transportation (Non-Medical): No  Physical Activity: Insufficiently Active (02/24/2023)   Exercise Vital Sign    Days of Exercise per Week: 3 days    Minutes of Exercise per Session: 30 min  Stress: No Stress Concern Present (02/24/2023)   [employer] of Occupational Health - Occupational Stress Questionnaire    Feeling of Stress : Not at all  Social Connections: Moderately Integrated (02/24/2023)   Social Connection and Isolation Panel [NHANES]    Frequency of Communication with Friends and Family: Three times a week    Frequency of Social Gatherings with Friends and Family: More than three times a week    Attends Religious Services: 1 to 4 times per year    Active Member of [employer] or Organizations: No    Attends Banker Meetings: Never    Marital Status: Married  Catering manager Violence: Not At Risk  (11/10/2022)   Humiliation, Afraid, Rape, and Kick questionnaire    Fear of Current or Ex-Partner: No    Emotionally Abused: No    Physically Abused: No    Sexually Abused: No    Past Surgical History:  Procedure Laterality Date   COLONOSCOPY     COLONOSCOPY WITH PROPOFOL N/A 07/04/2014   Procedure: COLONOSCOPY WITH PROPOFOL;  Surgeon: Rachael Fee, MD;  Location: [facility];  Service: Endoscopy;  Laterality: N/A;   COLONOSCOPY WITH PROPOFOL N/A 08/21/2020   Procedure: COLONOSCOPY WITH PROPOFOL;  Surgeon: Rachael Fee, MD;  Location: [facility];  Service: Endoscopy;  Laterality: N/A;   EAR CYST EXCISION N/A 05/14/2013   Procedure: 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;  Surgeon: Francene Finders, DDS;  Location: [facility] OR;  Service: Oral Surgery;  Laterality: N/A;   EXCISION OF ORAL TUMOR     PARTIAL NEPHRECTOMY Left 2006   POLYPECTOMY     POLYPECTOMY  08/21/2020   Procedure: POLYPECTOMY;  Surgeon: Rachael Fee, MD;  Location: [facility];  Service: Endoscopy;;   SURGERY SCROTAL / TESTICULAR  1980s   TOOTH EXTRACTION N/A 05/14/2013   Procedure: REMOVAL OF SUPERNUMERARY TOOTH NUMBER FIFTY-NINE WITH BONE GRAFTING;  Surgeon: Francene Finders, DDS;  Location: [facility] OR;  Service: Oral Surgery;  Laterality: N/A;    Family History  Adopted: Yes    Allergies  Allergen Reactions   Zocor [Simvastatin] Other (See Comments)    Patient states 'made him feel bad'    Current Outpatient Medications on File Prior to Visit  Medication Sig Dispense Refill   acetaminophen (TYLENOL) 500 MG tablet Take 500-1,000 mg by mouth every 6 (six) hours as needed (pain).     allopurinol (ZYLOPRIM) 300 MG tablet Take 1 tablet (300 mg total) by mouth daily. 30 tablet 0   chlorthalidone (HYGROTON) 25 MG tablet Take 1 tablet (25 mg total) by mouth daily. 30 tablet 5   fluocinonide (LIDEX) 0.05 % external solution Apply a small amount to affected areas of scalp  nightly as needed for itching. 60 mL 11   fluticasone (FLONASE) 50 MCG/ACT nasal spray Place 2 sprays into both nostrils daily. (Patient taking differently: Place 2 sprays into both nostrils daily as needed for allergies.) 16 g 1   hydrocortisone 2.5 % cream Apply a small amount to affected area twice daily when flaring 30 g 6   ibuprofen (ADVIL) 200 MG tablet Take 200-400 mg by mouth every 8 (eight) hours as needed (pain).     metFORMIN (GLUCOPHAGE) 500 MG tablet Take 1 tablet (500 mg total) by mouth 2 (  two) times daily. 180 tablet 0   methocarbamol (ROBAXIN) 500 MG tablet Take 1 tablet (500 mg total) by mouth every 8 (eight) hours as needed for muscle spasms. 30 tablet 0   mupirocin ointment (BACTROBAN) 2 % Apply thin layer to affected areas of skin twice a day. 22 g 0   predniSONE (STERAPRED UNI-PAK 21 TAB) 5 MG (21) TBPK tablet Use as directed on package.  Take with food. 21 tablet 1   rosuvastatin (CRESTOR) 20 MG tablet Take 1 tablet (20 mg total) by mouth daily. 90 tablet 0   No current facility-administered medications on file prior to visit.    BP 138/80   Pulse 73   Resp 18   Ht 6\' 3"  (1.905 m)   Wt 231 lb (104.8 kg)   SpO2 99%   BMI 28.87 kg/m        Objective:   Physical Exam  General Mental Status- Alert. General Appearance- Not in acute distress.   Skin General: Color- Normal Color. Moisture- Normal Moisture.  Neck Carotid Arteries- Normal color. Moisture- Normal Moisture. No carotid bruits. No JVD.  Chest and Lung Exam Auscultation: Breath Sounds:-CTA  Cardiovascular Auscultation:Rythm- RRR Murmurs & Other Heart Sounds:Auscultation of the heart reveals- No Murmurs.  Abdomen Inspection:-Inspeection Normal. Palpation/Percussion:Note:No mass. Palpation and Percussion of the abdomen reveal- Non Tender, Non Distended + BS, no rebound or guarding.  Neurologic Cranial Nerve exam:- CN III-XII intact(No nystagmus), symmetric smile. Strength:- 5/5 equal and  symmetric strength both upper and lower extremities.       Assessment & Plan:  For diabetes will get A1c and urine microalbumin. Continue metformin.   For htn continue chlorthalidone.   For gout get uric acid and continue allopurinol.   For high cholesterol continue crestor 20 mg daily.    Slight frequent urination. Will get psa.  Declined pcv 20 vaccine, flu vaccine and shingrix today.  Follow up  date to be determined after lab review.    Esperanza Richters, PA-C

## 2023-02-28 NOTE — Addendum Note (Signed)
Addended by: Gwenevere Abbot on: 02/28/2023 05:53 PM   Modules accepted: Orders

## 2023-02-28 NOTE — Addendum Note (Signed)
Addended by: Gwenevere Abbot on: 02/28/2023 09:39 PM   Modules accepted: Orders

## 2023-03-01 ENCOUNTER — Other Ambulatory Visit: Payer: Self-pay

## 2023-03-01 ENCOUNTER — Other Ambulatory Visit (HOSPITAL_COMMUNITY): Payer: Self-pay

## 2023-03-01 ENCOUNTER — Ambulatory Visit (INDEPENDENT_AMBULATORY_CARE_PROVIDER_SITE_OTHER): Payer: Medicare Other

## 2023-03-01 DIAGNOSIS — E119 Type 2 diabetes mellitus without complications: Secondary | ICD-10-CM | POA: Diagnosis not present

## 2023-03-01 LAB — HEMOGLOBIN A1C: Hgb A1c MFr Bld: 6.8 % — ABNORMAL HIGH (ref 4.6–6.5)

## 2023-03-01 MED ORDER — METFORMIN HCL 500 MG PO TABS
500.0000 mg | ORAL_TABLET | Freq: Two times a day (BID) | ORAL | 1 refills | Status: DC
  Filled 2023-03-01 – 2023-04-13 (×2): qty 180, 90d supply, fill #0
  Filled 2023-07-08 – 2023-07-14 (×2): qty 180, 90d supply, fill #1

## 2023-03-01 NOTE — Addendum Note (Signed)
Addended by: Gwenevere Abbot on: 03/01/2023 12:53 PM   Modules accepted: Orders

## 2023-03-01 NOTE — Addendum Note (Signed)
Addended by: Thelma Barge D on: 03/01/2023 08:30 AM   Modules accepted: Orders

## 2023-03-04 ENCOUNTER — Other Ambulatory Visit (HOSPITAL_COMMUNITY): Payer: Self-pay

## 2023-03-14 ENCOUNTER — Other Ambulatory Visit (HOSPITAL_COMMUNITY): Payer: Self-pay

## 2023-03-14 ENCOUNTER — Other Ambulatory Visit: Payer: Self-pay

## 2023-03-19 ENCOUNTER — Other Ambulatory Visit (HOSPITAL_COMMUNITY): Payer: Self-pay

## 2023-04-14 ENCOUNTER — Other Ambulatory Visit: Payer: Self-pay

## 2023-05-28 ENCOUNTER — Other Ambulatory Visit (HOSPITAL_COMMUNITY): Payer: Self-pay

## 2023-06-10 ENCOUNTER — Other Ambulatory Visit (HOSPITAL_COMMUNITY): Payer: Self-pay

## 2023-07-08 ENCOUNTER — Other Ambulatory Visit: Payer: Self-pay

## 2023-07-08 ENCOUNTER — Encounter: Payer: Self-pay | Admitting: Pharmacist

## 2023-07-08 ENCOUNTER — Other Ambulatory Visit (HOSPITAL_COMMUNITY): Payer: Self-pay

## 2023-07-08 ENCOUNTER — Other Ambulatory Visit: Payer: Self-pay | Admitting: Medical

## 2023-07-13 ENCOUNTER — Other Ambulatory Visit: Payer: Self-pay

## 2023-07-14 ENCOUNTER — Other Ambulatory Visit (HOSPITAL_COMMUNITY): Payer: Self-pay

## 2023-07-14 ENCOUNTER — Other Ambulatory Visit: Payer: Self-pay | Admitting: Medical

## 2023-07-14 MED ORDER — ALLOPURINOL 300 MG PO TABS
300.0000 mg | ORAL_TABLET | Freq: Every day | ORAL | 1 refills | Status: AC
  Filled 2023-07-14: qty 90, 90d supply, fill #0

## 2023-07-14 MED ORDER — CHLORTHALIDONE 25 MG PO TABS
25.0000 mg | ORAL_TABLET | Freq: Every day | ORAL | 1 refills | Status: AC
  Filled 2023-07-14: qty 90, 90d supply, fill #0

## 2023-07-14 MED ORDER — ROSUVASTATIN CALCIUM 20 MG PO TABS
20.0000 mg | ORAL_TABLET | Freq: Every day | ORAL | 1 refills | Status: AC
  Filled 2023-07-14: qty 90, 90d supply, fill #0

## 2023-07-15 ENCOUNTER — Other Ambulatory Visit (HOSPITAL_COMMUNITY): Payer: Self-pay

## 2023-08-30 ENCOUNTER — Other Ambulatory Visit (HOSPITAL_COMMUNITY): Payer: Self-pay

## 2023-08-30 ENCOUNTER — Other Ambulatory Visit: Payer: Self-pay

## 2023-08-30 MED ORDER — ROSUVASTATIN CALCIUM 20 MG PO TABS
20.0000 mg | ORAL_TABLET | Freq: Every day | ORAL | 3 refills | Status: AC
  Filled 2023-09-24 – 2023-11-29 (×3): qty 90, 90d supply, fill #0
  Filled 2024-02-23: qty 90, 90d supply, fill #1

## 2023-08-30 MED ORDER — CHLORTHALIDONE 25 MG PO TABS
25.0000 mg | ORAL_TABLET | Freq: Every day | ORAL | 3 refills | Status: AC
  Filled 2023-09-24: qty 90, 90d supply, fill #0
  Filled 2023-12-21: qty 90, 90d supply, fill #1

## 2023-08-30 MED ORDER — ALLOPURINOL 300 MG PO TABS
300.0000 mg | ORAL_TABLET | Freq: Every day | ORAL | 3 refills | Status: AC
  Filled 2023-08-30: qty 90, 90d supply, fill #0
  Filled 2023-11-29: qty 90, 90d supply, fill #1
  Filled 2024-02-23: qty 90, 90d supply, fill #2

## 2023-08-30 MED ORDER — METFORMIN HCL 500 MG PO TABS: 500.0000 mg | ORAL_TABLET | Freq: Two times a day (BID) | ORAL | 3 refills | Status: AC

## 2023-09-24 ENCOUNTER — Other Ambulatory Visit (HOSPITAL_COMMUNITY): Payer: Self-pay

## 2023-09-26 ENCOUNTER — Other Ambulatory Visit: Payer: Self-pay

## 2023-09-28 ENCOUNTER — Other Ambulatory Visit: Payer: Self-pay

## 2023-09-28 ENCOUNTER — Other Ambulatory Visit (HOSPITAL_COMMUNITY): Payer: Self-pay

## 2023-09-29 ENCOUNTER — Other Ambulatory Visit: Payer: Self-pay

## 2023-09-30 ENCOUNTER — Other Ambulatory Visit: Payer: Self-pay

## 2023-09-30 ENCOUNTER — Other Ambulatory Visit (HOSPITAL_COMMUNITY): Payer: Self-pay

## 2023-10-03 ENCOUNTER — Other Ambulatory Visit (HOSPITAL_COMMUNITY): Payer: Self-pay

## 2023-10-05 ENCOUNTER — Other Ambulatory Visit: Payer: Self-pay

## 2023-10-11 ENCOUNTER — Other Ambulatory Visit: Payer: Self-pay

## 2023-10-11 ENCOUNTER — Other Ambulatory Visit (HOSPITAL_COMMUNITY): Payer: Self-pay

## 2023-10-11 MED ORDER — METFORMIN HCL 500 MG PO TABS
500.0000 mg | ORAL_TABLET | Freq: Two times a day (BID) | ORAL | 3 refills | Status: AC
  Filled 2023-10-11: qty 180, 90d supply, fill #0
  Filled 2024-01-18: qty 180, 90d supply, fill #1

## 2023-11-29 ENCOUNTER — Other Ambulatory Visit: Payer: Self-pay

## 2023-11-29 ENCOUNTER — Other Ambulatory Visit (HOSPITAL_COMMUNITY): Payer: Self-pay

## 2023-11-30 ENCOUNTER — Other Ambulatory Visit: Payer: Self-pay

## 2023-12-21 ENCOUNTER — Other Ambulatory Visit (HOSPITAL_COMMUNITY): Payer: Self-pay

## 2024-01-18 ENCOUNTER — Other Ambulatory Visit (HOSPITAL_COMMUNITY): Payer: Self-pay

## 2024-02-23 ENCOUNTER — Other Ambulatory Visit (HOSPITAL_COMMUNITY): Payer: Self-pay

## 2024-03-20 ENCOUNTER — Other Ambulatory Visit: Payer: Self-pay

## 2024-03-20 ENCOUNTER — Other Ambulatory Visit (HOSPITAL_COMMUNITY): Payer: Self-pay

## 2024-03-20 MED ORDER — ROSUVASTATIN CALCIUM 20 MG PO TABS
20.0000 mg | ORAL_TABLET | Freq: Every day | ORAL | 1 refills | Status: AC
  Filled 2024-03-20: qty 90, 90d supply, fill #0

## 2024-03-20 MED ORDER — METFORMIN HCL 500 MG PO TABS
500.0000 mg | ORAL_TABLET | Freq: Two times a day (BID) | ORAL | 1 refills | Status: AC
  Filled 2024-03-20 – 2024-03-26 (×2): qty 180, 90d supply, fill #0

## 2024-03-20 MED ORDER — ALLOPURINOL 300 MG PO TABS
300.0000 mg | ORAL_TABLET | Freq: Every day | ORAL | 1 refills | Status: AC
  Filled 2024-03-20: qty 90, 90d supply, fill #0

## 2024-03-20 MED ORDER — CHLORTHALIDONE 25 MG PO TABS
25.0000 mg | ORAL_TABLET | Freq: Every day | ORAL | 1 refills | Status: AC
  Filled 2024-03-20: qty 90, 90d supply, fill #0

## 2024-03-26 ENCOUNTER — Other Ambulatory Visit (HOSPITAL_COMMUNITY): Payer: Self-pay

## 2024-03-26 ENCOUNTER — Other Ambulatory Visit: Payer: Self-pay

## 2024-04-12 ENCOUNTER — Other Ambulatory Visit (HOSPITAL_COMMUNITY): Payer: Self-pay
